# Patient Record
Sex: Male | Born: 1995 | Race: Black or African American | Hispanic: No | Marital: Single | Smoking: Never smoker
Health system: Southern US, Community
[De-identification: ages and names within clinical notes are randomized; demographics above are authoritative.]

## PROBLEM LIST (undated history)

## (undated) DIAGNOSIS — D709 Neutropenia, unspecified: Secondary | ICD-10-CM

## (undated) DIAGNOSIS — A549 Gonococcal infection, unspecified: Secondary | ICD-10-CM

## (undated) DIAGNOSIS — B20 Human immunodeficiency virus [HIV] disease: Secondary | ICD-10-CM

## (undated) DIAGNOSIS — F17200 Nicotine dependence, unspecified, uncomplicated: Secondary | ICD-10-CM

## (undated) HISTORY — DX: Nicotine dependence, unspecified, uncomplicated: F17.200

## (undated) HISTORY — DX: Human immunodeficiency virus (HIV) disease: B20

## (undated) HISTORY — DX: Gonococcal infection, unspecified: A54.9

## (undated) HISTORY — DX: Neutropenia, unspecified: D70.9

---

## 2016-12-03 ENCOUNTER — Other Ambulatory Visit: Payer: Self-pay

## 2016-12-03 ENCOUNTER — Encounter (HOSPITAL_COMMUNITY): Payer: Self-pay

## 2016-12-03 DIAGNOSIS — R531 Weakness: Secondary | ICD-10-CM | POA: Diagnosis not present

## 2016-12-03 DIAGNOSIS — R05 Cough: Secondary | ICD-10-CM | POA: Diagnosis not present

## 2016-12-03 DIAGNOSIS — R1084 Generalized abdominal pain: Secondary | ICD-10-CM | POA: Insufficient documentation

## 2016-12-03 DIAGNOSIS — R509 Fever, unspecified: Secondary | ICD-10-CM | POA: Diagnosis present

## 2016-12-03 DIAGNOSIS — R5383 Other fatigue: Secondary | ICD-10-CM | POA: Insufficient documentation

## 2016-12-03 LAB — COMPREHENSIVE METABOLIC PANEL
ALBUMIN: 3.6 g/dL (ref 3.5–5.0)
ALT: 30 U/L (ref 17–63)
ANION GAP: 6 (ref 5–15)
AST: 38 U/L (ref 15–41)
Alkaline Phosphatase: 37 U/L — ABNORMAL LOW (ref 38–126)
BILIRUBIN TOTAL: 0.7 mg/dL (ref 0.3–1.2)
BUN: 12 mg/dL (ref 6–20)
CHLORIDE: 101 mmol/L (ref 101–111)
CO2: 25 mmol/L (ref 22–32)
Calcium: 8.6 mg/dL — ABNORMAL LOW (ref 8.9–10.3)
Creatinine, Ser: 1.29 mg/dL — ABNORMAL HIGH (ref 0.61–1.24)
GFR calc Af Amer: >60 mL/min (ref >60)
GFR calc non Af Amer: >60 mL/min (ref >60)
GLUCOSE: 109 mg/dL — AB (ref 65–99)
POTASSIUM: 4.1 mmol/L (ref 3.5–5.1)
Sodium: 132 mmol/L — ABNORMAL LOW (ref 135–145)
TOTAL PROTEIN: 7.3 g/dL (ref 6.5–8.1)

## 2016-12-03 LAB — URINALYSIS, ROUTINE W REFLEX MICROSCOPIC
Bacteria, UA: NONE SEEN
Bilirubin Urine: NEGATIVE
GLUCOSE, UA: NEGATIVE mg/dL
HGB URINE DIPSTICK: NEGATIVE
Ketones, ur: NEGATIVE mg/dL
Leukocytes, UA: NEGATIVE
Nitrite: NEGATIVE
Protein, ur: 30 mg/dL — AB
SPECIFIC GRAVITY, URINE: 1.033 — AB (ref 1.005–1.030)
Squamous Epithelial / LPF: NONE SEEN
pH: 6 (ref 5.0–8.0)

## 2016-12-03 LAB — CBC
HCT: 44 % (ref 39.0–52.0)
HEMOGLOBIN: 14.8 g/dL (ref 13.0–17.0)
MCH: 27.9 pg (ref 26.0–34.0)
MCHC: 33.6 g/dL (ref 30.0–36.0)
MCV: 83 fL (ref 78.0–100.0)
Platelets: 202 10*3/uL (ref 150–400)
RBC: 5.3 MIL/uL (ref 4.22–5.81)
RDW: 13.4 % (ref 11.5–15.5)
WBC: 4.2 10*3/uL (ref 4.0–10.5)

## 2016-12-03 LAB — LIPASE, BLOOD: LIPASE: 48 U/L (ref 11–51)

## 2016-12-03 LAB — I-STAT CG4 LACTIC ACID, ED: Lactic Acid, Venous: 0.81 mmol/L (ref 0.5–1.9)

## 2016-12-03 MED ORDER — ACETAMINOPHEN 325 MG PO TABS
650.0000 mg | ORAL_TABLET | Freq: Once | ORAL | Status: AC
  Administered 2016-12-03: 650 mg via ORAL
  Filled 2016-12-03: qty 2

## 2016-12-03 NOTE — ED Triage Notes (Signed)
[facility] for cough, congestion and right sided abdominal pain. States that the pain resolved and has now returned and increased fever and cough. Patient alert and oriented, no vomiting, denies diarrhea

## 2016-12-04 ENCOUNTER — Emergency Department (HOSPITAL_COMMUNITY): Payer: BLUE CROSS/BLUE SHIELD

## 2016-12-04 ENCOUNTER — Emergency Department (HOSPITAL_COMMUNITY)
Admission: EM | Admit: 2016-12-04 | Discharge: 2016-12-04 | Disposition: A | Discharge status: Home / Self Care | Payer: BLUE CROSS/BLUE SHIELD | Attending: Emergency Medicine | Admitting: Emergency Medicine

## 2016-12-04 DIAGNOSIS — R6889 Other general symptoms and signs: Secondary | ICD-10-CM

## 2016-12-04 DIAGNOSIS — R509 Fever, unspecified: Secondary | ICD-10-CM

## 2016-12-04 LAB — RAPID STREP SCREEN (MED CTR MEBANE ONLY): Streptococcus, Group A Screen (Direct): NEGATIVE

## 2016-12-04 LAB — RAPID HIV SCREEN (HIV 1/2 AB+AG)
HIV 1/2 ANTIBODIES: NONREACTIVE
HIV-1 P24 ANTIGEN - HIV24: NONREACTIVE

## 2016-12-04 MED ORDER — SODIUM CHLORIDE 0.9 % IV BOLUS (SEPSIS)
1000.0000 mL | Freq: Once | INTRAVENOUS | Status: AC
  Administered 2016-12-04: 1000 mL via INTRAVENOUS

## 2016-12-04 MED ORDER — ACETAMINOPHEN 500 MG PO TABS
1000.0000 mg | ORAL_TABLET | Freq: Once | ORAL | Status: AC
  Administered 2016-12-04: 1000 mg via ORAL
  Filled 2016-12-04: qty 2

## 2016-12-04 MED ORDER — ONDANSETRON HCL 4 MG/2ML IJ SOLN
4.0000 mg | Freq: Once | INTRAMUSCULAR | Status: AC
  Administered 2016-12-04: 4 mg via INTRAVENOUS
  Filled 2016-12-04: qty 2

## 2016-12-04 NOTE — ED Notes (Signed)
Patient transported to X-ray 

## 2016-12-04 NOTE — ED Provider Notes (Signed)
8:00 AM Patient feels much better at this time.  Discharged home in good condition.  HIV testing will be obtained prior to discharge.  Patient will be contacted if results come back positive.  Feels better at this time.  Likely viral illness.  Recommended ongoing hydration and fever control at home.  Patient understands return to the emergency department for new or worsening symptoms.   Azalia Bilisampos, De Libman, MD 12/04/16 [phone]

## 2016-12-04 NOTE — ED Provider Notes (Signed)
[facility] EMERGENCY [facility]PARTMENT Provider Note   CSN: [id number] Arrival date & time: 12/03/16  1814     History   Chief Complaint Chief Complaint  Patient presents with  . Abdominal Pain    HPI Esmond CamperMarkell Elsass is a 21 y.o. male.  HPI  This is a 21 year old male who presents with fever, abdominal pain, cough, generalized weakness.  Patient states that he started feeling poorly on Thursday.  He felt generally weak and fatigued.  He has had chills.  He began to have abdominal pain yesterday morning with multiple episodes of emesis.  He reports normal bowel movements.  He was seen in urgent care.  Reports having a negative mono and flu test.  Reports a "scratchy" throat.  No difficulty tolerating fluids.  Patient was told that if he has persistent abdominal pain he would need to be reevaluated.  Patient states that he has had continued but not worsening abdominal pain.  It is in the right lower quadrant.  It is nonradiating.  It is currently 2 out of 10.  Febrile upon evaluation in triage.  History reviewed. No pertinent past medical history.  There are no active problems to display for this patient.   History reviewed. No pertinent surgical history.     Home Medications    Prior to Admission medications   Medication Sig Start Date End Date Taking? Authorizing Provider  benzonatate (TESSALON) 200 MG capsule Take 200 mg by mouth 3 (three) times daily as needed for cough.  12/03/16  Yes [provider]  ondansetron (ZOFRAN-ODT) 4 MG disintegrating tablet 4 mg every 8 (eight) hours as needed for nausea.  12/03/16  Yes [provider]    Family History No family history on file.  Social History Social History   Tobacco Use  . Smoking status: Never Smoker  . Smokeless tobacco: Never Used  Substance Use Topics  . Alcohol use: Not on file  . Drug use: Not on file     Allergies   Patient has no known allergies.   Review of  Systems Review of Systems  Constitutional: Positive for chills and fever.  HENT: Positive for sore throat. Negative for congestion and trouble swallowing.   Respiratory: Positive for cough. Negative for shortness of breath.   Cardiovascular: Negative for chest pain.  Gastrointestinal: Positive for abdominal pain, nausea and vomiting. Negative for constipation and diarrhea.  Genitourinary: Negative for dysuria.  All other systems reviewed and are negative.    Physical Exam Updated Vital Signs BP 109/71   Pulse 90   Temp (!) 103 F (39.4 C) (Oral)   Resp 18   Ht 5\' 11"  (1.803 m)   Wt 65.8 kg (145 lb)   SpO2 98%   BMI 20.22 kg/m   Physical Exam  Constitutional: He is oriented to person, place, and time.  Non-toxic appearance. He appears ill.  HENT:  Head: Normocephalic and atraumatic.  Mouth/Throat: Oropharyngeal exudate present.  Bilateral tonsillar swelling with tonsillar exudate Mucous membranes dry  Eyes: Pupils are equal, round, and reactive to light.  Neck: Neck supple.  Cardiovascular: Regular rhythm and normal heart sounds.  No murmur heard. Tachycardia  Pulmonary/Chest: Effort normal and breath sounds normal. No respiratory distress. He has no wheezes.  Abdominal: Soft. Bowel sounds are normal. There is no tenderness. There is no rebound.  Musculoskeletal: He exhibits no edema.  Lymphadenopathy:    He has no cervical adenopathy.  Neurological: He is alert and oriented to person, place,  and time.  Skin: Skin is warm and dry.  Psychiatric: He has a normal mood and affect.  Nursing note and vitals reviewed.    ED Treatments / Results  Labs (all labs ordered are listed, but only abnormal results are displayed) Labs Reviewed  COMPREHENSIVE METABOLIC PANEL - Abnormal; Notable for the following components:      Result Value   Sodium 132 (*)    Glucose, Bld 109 (*)    Creatinine, Ser 1.29 (*)    Calcium 8.6 (*)    Alkaline Phosphatase 37 (*)    All other  components within normal limits  URINALYSIS, ROUTINE W REFLEX MICROSCOPIC - Abnormal; Notable for the following components:   Specific Gravity, Urine 1.033 (*)    Protein, ur 30 (*)    All other components within normal limits  RAPID STREP SCREEN (NOT AT [facility])  CULTURE, GROUP A STREP ([facility])  LIPASE, BLOOD  CBC  HIV ANTIBODY (ROUTINE TESTING)  RAPID HIV SCREEN (HIV 1/2 AB+AG)  I-STAT CG4 LACTIC ACID, ED    EKG  EKG Interpretation None       Radiology Dg Chest 2 View  Result Date: 12/04/2016 CLINICAL DATA:  21 year old male with cough and fever. EXAM: CHEST  2 VIEW COMPARISON:  None. FINDINGS: The heart size and mediastinal contours are within normal limits. Both lungs are clear. The visualized skeletal structures are unremarkable. IMPRESSION: No active cardiopulmonary disease. Electronically Signed   By: Elgie CollardArash  Radparvar M.D.   On: 12/04/2016 06:33    Procedures Procedures (including critical care time)  Medications Ordered in ED Medications  acetaminophen (TYLENOL) tablet 650 mg (650 mg Oral Given 12/03/16 1835)  sodium chloride 0.9 % bolus 1,000 mL (1,000 mLs Intravenous New Bag/Given 12/04/16 0611)  ondansetron (ZOFRAN) injection 4 mg (4 mg Intravenous Given 12/04/16 0611)  acetaminophen (TYLENOL) tablet 1,000 mg (1,000 mg Oral Given 12/04/16 0612)     Initial Impression / Assessment and Plan / ED Course  I have reviewed the triage vital signs and the nursing notes.  Pertinent labs & imaging results that were available during my care of the patient were reviewed by me and considered in my medical decision making (see chart for details).  Clinical Course as of Dec 05 718  Mon Dec 04, 2016  [id number] Patient with negative strep screen and x-ray.  Would suspect with a fever of 103 he would have signs of peritonitis if this was related to appendicitis.  Overall clinical picture is more concerning for a systemic illness such as a virus.  I did asked the patient further history  questions.  He does engage in unprotected sex with men which would put him at risk for HIV.  I discussed with him sending an HIV screen which he consents to.  He is hungry and would like to eat.  Repeat abdominal exam is benign.  Patient was provided with food.  [CH]    Clinical Course User Index [CH] Horton, Mayer Maskerourtney F, MD    Patient presents with upper respiratory symptoms, cough, abdominal pain, and fever.  Nontoxic but ill-appearing on exam.  He is actively coughing.  He also has evidence of tonsillar exudate.  Her abdominal exam is fairly benign without point tenderness.  Lab work notable for mild acute kidney injury and evidence of dehydration.  No leukocytosis.  At this time would doubt appendicitis given reassuring exam and no white count in the setting of a fever of 103.  Much more suspicious of a flulike illness.  Strep screen and chest x-ray are negative.  He reports negative strep screen at urgent care.  See clinical course above.  HIV testing pending.  Patient requesting food.  Fluids and antiemetic provided as well.  Final Clinical Impressions(s) / ED Diagnoses   Final diagnoses:  Fever in adult  Flu-like symptoms    ED Discharge Orders    None       Horton, Mayer Masker, MD 12/04/16 [phone]

## 2016-12-06 LAB — CULTURE, GROUP A STREP (THRC)

## 2016-12-13 LAB — HIV 1/2 AB DIFFERENTIATION
HIV 1 AB: NEGATIVE
HIV 2 AB: NEGATIVE
NOTE (HIV CONF MULTISPOT): NEGATIVE

## 2016-12-13 LAB — HIV ANTIBODY (ROUTINE TESTING W REFLEX): HIV SCREEN 4TH GENERATION: REACTIVE — AB

## 2016-12-13 LAB — RNA QUALITATIVE

## 2016-12-15 ENCOUNTER — Telehealth: Payer: Self-pay | Admitting: Pharmacist Clinician (PhC)/ Clinical Pharmacy Specialist

## 2016-12-15 ENCOUNTER — Ambulatory Visit (INDEPENDENT_AMBULATORY_CARE_PROVIDER_SITE_OTHER): Payer: BLUE CROSS/BLUE SHIELD | Admitting: Infectious Disease

## 2016-12-15 ENCOUNTER — Ambulatory Visit (INDEPENDENT_AMBULATORY_CARE_PROVIDER_SITE_OTHER): Payer: BLUE CROSS/BLUE SHIELD | Admitting: Pharmacist

## 2016-12-15 ENCOUNTER — Encounter: Payer: Self-pay | Admitting: Infectious Disease

## 2016-12-15 ENCOUNTER — Other Ambulatory Visit (HOSPITAL_COMMUNITY)
Admission: RE | Admit: 2016-12-15 | Discharge: 2016-12-15 | Disposition: A | Discharge status: Home / Self Care | Payer: BLUE CROSS/BLUE SHIELD | Source: Ambulatory Visit | Attending: Infectious Disease | Admitting: Infectious Disease

## 2016-12-15 VITALS — BP 141/82 | HR 102 | Temp 98.9°F | Ht 71.5 in | Wt 140.0 lb

## 2016-12-15 DIAGNOSIS — Z23 Encounter for immunization: Secondary | ICD-10-CM

## 2016-12-15 DIAGNOSIS — B20 Human immunodeficiency virus [HIV] disease: Secondary | ICD-10-CM | POA: Diagnosis not present

## 2016-12-15 HISTORY — DX: Human immunodeficiency virus (HIV) disease: B20

## 2016-12-15 LAB — T-HELPER CELL (CD4) - (RCID CLINIC ONLY)
CD4 % Helper T Cell: 15 % — ABNORMAL LOW (ref 33–55)
CD4 T CELL ABS: 420 /uL (ref 400–2700)

## 2016-12-15 MED ORDER — BICTEGRAVIR-EMTRICITAB-TENOFOV 50-200-25 MG PO TABS: 1.0000 | ORAL_TABLET | Freq: Every day | ORAL | 11 refills | Status: DC

## 2016-12-15 MED FILL — BIKTARVY 50-200-25 MG TABS: 50-200-25 | 30 days supply | Qty: 30 | Fill #0

## 2016-12-15 NOTE — Progress Notes (Signed)
[facility]acy Visit  HPI: Arthur Nguyen is a 21 y.o. male who presents to the [facility] clinic today as new HIV patient to establish care.  Patient Active Problem List   Diagnosis Date Noted  . Acute HIV infection (HCC) 12/15/2016      Medication List        Accurate as of 12/15/16 10:20 AM. Always use your most recent med list.          benzonatate 200 MG capsule Commonly known as:  TESSALON   bictegravir-emtricitabine-tenofovir AF 50-200-25 MG Tabs tablet Commonly known as:  BIKTARVY Take 1 tablet by mouth daily.   ondansetron 4 MG disintegrating tablet Commonly known as:  ZOFRAN-ODT       Where to Get Your Medications    These medications were sent to [facility] [place], [place] - [address]  [address], [place] [address]   Phone:  [phone]   bictegravir-emtricitabine-tenofovir AF 50-200-25 MG Tabs tablet     Allergies: No Known Allergies  Past Medical History: Past Medical History:  Diagnosis Date  . Acute HIV infection (HCC) 12/15/2016    Social History: Social History   Socioeconomic History  . Marital status: Single    Spouse name: Not on file  . Number of children: Not on file  . Years of education: Not on file  . Highest education level: Not on file  Social Needs  . Financial resource strain: Not on file  . Food insecurity - worry: Not on file  . Food insecurity - inability: Not on file  . Transportation needs - medical: Not on file  . Transportation needs - non-medical: Not on file  Occupational History  . Not on file  Tobacco Use  . Smoking status: Never Smoker  . Smokeless tobacco: Never Used  Substance and Sexual Activity  . Alcohol use: Not on file  . Drug use: Not on file  . Sexual activity: Not on file  Other Topics Concern  . Not on file  Social History Narrative  . Not on file    Labs: No results found for: HIV1RNAQUANT, HIV1RNAVL, CD4TABS,  HEPBSAB, HEPBSAG, HCVAB  Lipids: No results found for: CHOL, TRIG, HDL, CHOLHDL, VLDL, LDLCALC  Current HIV Regimen: None  Assessment: [name] is here today to establish care with Dr. Daiva EvesVan Dam for his newly diagnosed HIV infection. He is a brand new diagnosis of acute HIV, so we will rapid start him on medications today.  He is dealing with his diagnosis well and knows of other friends that are HIV positive and undetectable.  We decided to start him on Biktarvy today.  I spent time going over the medication with him and how to take it. Emphasized the importance of compliance in order to prevent resistance from developing. I told him to avoid multivitamins or take them separate from [place]. He takes no vitamins at this time.  I also went over possible side effects and gave him my card to call me ASAP if he has any issues.  [facility], and he will pick up today.  He will come back and see me in ~1 month.  Plan: - Start Biktarvy PO once daily - Take at same time every day - F/u with me again for adherence and labs on 01/18/17 at 9am  Leia Coletti L. Kadyn Guild, PharmD, AAHIVP, CPP Infectious Diseases Clinical Pharmacist [facility] 12/15/2016, 10:20 AM

## 2016-12-15 NOTE — Telephone Encounter (Signed)
error 

## 2016-12-15 NOTE — Addendum Note (Signed)
Addended by: Wendall MolaOCKERHAM, Sanjna Haskew A on: 12/15/2016 11:27 AM   Modules accepted: Orders

## 2016-12-15 NOTE — Progress Notes (Signed)
I contacted pt on Thursday and arranged for him to come to [facility] at 930 appt slot but gave him green light to come at 9am. He has acute HIV and needs rapid start w Biktarvy vs Symtuza.   I did not tell him the diagnosis over the phone but affirmed his question that he was "positive for something" but something that he would need to see me to manage.

## 2016-12-15 NOTE — Progress Notes (Signed)
Chief complaint: recent flu like symptoms  Subjective:    Patient ID: Arthur Nguyen, male    DOB: July 05, 1995, 21 y.o.   MRN: [id number]  HPI  Arthur Nguyen is a 21 year old Black man who has just been diagnosed with Acute HIV infection.  [facility] had [facility] with flu like symptoms of malaise, fevers, chills, emesis, constipation, sore throat, worsening abdominal pain. He had been seen and checked for flu and mono which were both negative prior to being seen at ED. ED did further workup and tested him for HIV via rapid test which was negative and full fledged 4th gen assay which was +. Unfortunately the turnaround to 3rd step of the test was such that we were only alerted to + result on Wednesday night. (His EIA/p24 was +, discriminatory negative, qual RNA + = Acute HIV with qual RNA coming back so late)  I reached out to him yesterday. We considered possibility of offering him ACTG study for acute infection but he would seem too far along for Fiebig 1 and due to logistics as well seemed best to bring him in and initiate via rapid start today.  I had not revealed diagnosis over the phone but he figured out what was going on quickly. He was already also visited by [facility].   He has Manuel's card as well.  His risk factors for HIV are having sex with other men and being a young Black man.   He does not use IVDU  He had childhood asthma and has had seasonal allergies but no other major medical problems. No surgeries.  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  He has talked to several of his peers who are living with HIV and have undetectable viral load and this provided him with reassurance. He is very eager to start meds today.  Past Medical History:  Diagnosis Date  . Acute HIV infection (HCC) 12/15/2016    No past surgical history on file.  No family history on file.    Social  History   Socioeconomic History  . Marital status: Single    Spouse name: Not on file  . Number of children: Not on file  . Years of education: Not on file  . Highest education level: Not on file  Social Needs  . Financial resource strain: Not on file  . Food insecurity - worry: Not on file  . Food insecurity - inability: Not on file  . Transportation needs - medical: Not on file  . Transportation needs - non-medical: Not on file  Occupational History  . Not on file  Tobacco Use  . Smoking status: Never Smoker  . Smokeless tobacco: Never Used  Substance and Sexual Activity  . Alcohol use: Not on file  . Drug use: Not on file  . Sexual activity: Not on file  Other Topics Concern  . Not on file  Social History Narrative  . Not on file    No Known Allergies   Current Outpatient Medications:  .  benzonatate (TESSALON) 200 MG capsule, Take 200 mg by mouth 3 (three) times daily as needed for cough. , Disp: , Rfl:  .  bictegravir-emtricitabine-tenofovir AF (BIKTARVY) 50-200-25 MG TABS tablet, Take 1 tablet by mouth daily., Disp: 30 tablet, Rfl: 11 .  ondansetron (ZOFRAN-ODT) 4 MG disintegrating tablet, 4 mg every 8 (eight) hours as needed  for nausea. , Disp: , Rfl:    Review of Systems  Constitutional: Positive for chills, fatigue and fever. Negative for activity change, appetite change, diaphoresis and unexpected weight change.  HENT: Positive for sore throat. Negative for congestion, rhinorrhea, sinus pressure, sneezing and trouble swallowing.   Eyes: Negative for photophobia and visual disturbance.  Respiratory: Negative for cough, chest tightness, shortness of breath, wheezing and stridor.   Cardiovascular: Negative for chest pain, palpitations and leg swelling.  Gastrointestinal: Positive for abdominal pain and vomiting. Negative for abdominal distention, anal bleeding, blood in stool, constipation, diarrhea and nausea.  Genitourinary: Negative for difficulty urinating,  dysuria, flank pain and hematuria.  Musculoskeletal: Negative for arthralgias, back pain, gait problem, joint swelling and myalgias.  Skin: Negative for color change, pallor, rash and wound.  Neurological: Negative for dizziness, tremors, weakness and light-headedness.  Hematological: Negative for adenopathy. Does not bruise/bleed easily.  Psychiatric/Behavioral: Negative for agitation, behavioral problems, confusion, decreased concentration, dysphoric mood and sleep disturbance. The patient is nervous/anxious.        Objective:   Physical Exam  Constitutional: He is oriented to person, place, and time. He appears well-developed and well-nourished.  HENT:  Head: Normocephalic and atraumatic.  Mouth/Throat: Oropharynx is clear and moist. No oropharyngeal exudate.  Eyes: Conjunctivae and EOM are normal. Right eye exhibits no discharge. Left eye exhibits no discharge. No scleral icterus.  Neck: Normal range of motion. Neck supple.  Cardiovascular: Normal rate, regular rhythm and normal heart sounds. Exam reveals no gallop and no friction rub.  No murmur heard. Pulmonary/Chest: Effort normal and breath sounds normal. No respiratory distress. He has no wheezes. He has no rales. He exhibits no tenderness.  Abdominal: Soft. Bowel sounds are normal. He exhibits no distension.  Musculoskeletal: Normal range of motion. He exhibits no edema or tenderness.  Lymphadenopathy:    He has no cervical adenopathy.  Neurological: He is alert and oriented to person, place, and time. Coordination normal.  Skin: Skin is warm and dry. No rash noted. No erythema. No pallor.  Psychiatric: He has a normal mood and affect. His behavior is normal. Judgment and thought content normal.          Assessment & Plan:   Acute HIV infection: we will get baseline labs today. I have sent Biktarvy 30 d supply [facility] with 11 refills. We have activated copay card. Will bring him back in one month for repeat labs  and then visit with me in 5 weeks  Flu vaccine, pneumovaccine. Get records re meningococcus, hep b, a, HPV (doubt he was vaccinated fort this)  I spent greater than 60 minutes with the patient including greater than 50% of time in face to face counsel of the patient and in coordination of his care with ID pharmacy and specific counsel to him with re to the nature of HIV infection, specifically acute infection. I emphasized that at this very moment he would likely be highly infectious to others with whom he might have sex due to him undoubtedly having a viral load in the 100's of thousands to more likely millions of copies per ml. I asked him not to have sex until we could get his virus under control. We discussed ARV options and decided on Biktarvy. I told him this potent integrase inhibitor would be able to rapidly drop his viral load in a few weeks to a month, should have low side effects. I emphasized to him that we would be able to preserve his  likely high baseline CD4 and that he should have essentially a life expectancy, qualitatively and quantitatively no different than a matched peer. I told him about peer support services via [place] [facility], told him about [facility] (he has his card), our counselor, our dental services. I emphasized need for high level of adherence to his medication.

## 2016-12-16 LAB — QUANTIFERON TB GOLD ASSAY (BLOOD)
MITOGEN-NIL SO: 7.39 [IU]/mL
QUANTIFERON NIL VALUE: 0.16 [IU]/mL
QUANTIFERON TB AG MINUS NIL: 0.04 [IU]/mL
QUANTIFERON(R)-TB GOLD: NEGATIVE

## 2016-12-18 LAB — URINE CYTOLOGY ANCILLARY ONLY
CHLAMYDIA, DNA PROBE: NEGATIVE
Neisseria Gonorrhea: NEGATIVE

## 2016-12-18 LAB — CYTOLOGY, (ORAL, ANAL, URETHRAL) ANCILLARY ONLY
Chlamydia: NEGATIVE
Chlamydia: NEGATIVE
NEISSERIA GONORRHEA: NEGATIVE
NEISSERIA GONORRHEA: POSITIVE — AB

## 2016-12-20 ENCOUNTER — Telehealth: Payer: Self-pay | Admitting: *Deleted

## 2016-12-20 LAB — HEPATITIS C ANTIBODY
HEP C AB: NONREACTIVE
SIGNAL TO CUT-OFF: 0.06 (ref <1.00)

## 2016-12-20 LAB — COMPLETE METABOLIC PANEL WITH GFR
AG RATIO: 1.2 (calc) (ref 1.0–2.5)
ALKALINE PHOSPHATASE (APISO): 37 U/L — AB (ref 40–115)
ALT: 34 U/L (ref 9–46)
AST: 22 U/L (ref 10–40)
Albumin: 4.2 g/dL (ref 3.6–5.1)
BILIRUBIN TOTAL: 0.7 mg/dL (ref 0.2–1.2)
BUN: 7 mg/dL (ref 7–25)
CO2: 30 mmol/L (ref 20–32)
Calcium: 9.2 mg/dL (ref 8.6–10.3)
Chloride: 103 mmol/L (ref 98–110)
Creat: 0.89 mg/dL (ref 0.60–1.35)
GFR, Est African American: 141 mL/min/{1.73_m2} (ref >=60)
GFR, Est Non African American: 121 mL/min/{1.73_m2} (ref >=60)
GLOBULIN: 3.5 g/dL (ref 1.9–3.7)
Glucose, Bld: 92 mg/dL (ref 65–99)
POTASSIUM: 4.3 mmol/L (ref 3.5–5.3)
SODIUM: 141 mmol/L (ref 135–146)
Total Protein: 7.7 g/dL (ref 6.1–8.1)

## 2016-12-20 LAB — CBC WITH DIFFERENTIAL/PLATELET
BASOS PCT: 1.4 %
Basophils Absolute: 70 cells/uL (ref 0–200)
Eosinophils Absolute: 0 cells/uL — ABNORMAL LOW (ref 15–500)
Eosinophils Relative: 0 %
HCT: 44.6 % (ref 38.5–50.0)
Hemoglobin: 14.5 g/dL (ref 13.2–17.1)
Lymphs Abs: 2980 cells/uL (ref 850–3900)
MCH: 26.9 pg — ABNORMAL LOW (ref 27.0–33.0)
MCHC: 32.5 g/dL (ref 32.0–36.0)
MCV: 82.7 fL (ref 80.0–100.0)
MONOS PCT: 11.2 %
MPV: 8.8 fL (ref 7.5–12.5)
NEUTROS PCT: 27.8 %
Neutro Abs: 1390 cells/uL — ABNORMAL LOW (ref 1500–7800)
PLATELETS: 519 10*3/uL — AB (ref 140–400)
RBC: 5.39 10*6/uL (ref 4.20–5.80)
RDW: 13.2 % (ref 11.0–15.0)
TOTAL LYMPHOCYTE: 59.6 %
WBC mixed population: 560 cells/uL (ref 200–950)
WBC: 5 10*3/uL (ref 3.8–10.8)

## 2016-12-20 LAB — QUANTIFERON(R)-TB
Mitogen-Nil: 7.39 IU/mL
QUANTIFERON(R)-TB GOLD: NEGATIVE
Quantiferon Nil Value: 0.16 IU/mL
Quantiferon Tb Ag Minus Nil Value: 0.04 IU/mL

## 2016-12-20 LAB — HEPATITIS B SURFACE ANTIBODY, QUANTITATIVE

## 2016-12-20 LAB — LIPID PANEL
Cholesterol: 125 mg/dL (ref <200)
HDL: 31 mg/dL — ABNORMAL LOW (ref >40)
LDL Cholesterol (Calc): 77 mg/dL (calc)
NON-HDL CHOLESTEROL (CALC): 94 mg/dL (ref <130)
TRIGLYCERIDES: 83 mg/dL (ref <150)
Total CHOL/HDL Ratio: 4 (calc) (ref <5.0)

## 2016-12-20 LAB — HEPATITIS B SURFACE ANTIGEN: Hepatitis B Surface Ag: NONREACTIVE

## 2016-12-20 LAB — HLA B*5701: HLA-B 5701 W/RFLX HLA-B HIGH: NEGATIVE

## 2016-12-20 LAB — RPR: RPR Ser Ql: NONREACTIVE

## 2016-12-20 LAB — HEPATITIS A ANTIBODY, TOTAL: Hepatitis A AB,Total: NONREACTIVE

## 2016-12-20 NOTE — Telephone Encounter (Signed)
Seems like we should if there is no other way to get in touch with him so he is aware and treated. Thank you.

## 2016-12-20 NOTE — Telephone Encounter (Signed)
Received voice mail back from [facility]. Called back and got voice mail and left patient's name, date of birth, and phone number.

## 2016-12-20 NOTE — Telephone Encounter (Signed)
Called x 2 yesterday and today and no answer and no voice mail. Do you want me to notify the [facility] Department to go out to his home? Wendall MolaJacqueline Malone Vanblarcom CMA

## 2016-12-20 NOTE — Telephone Encounter (Signed)
Left message with Cheryln ManlyBrandy Sessoms at [facility] [phone] stating we have an STD to report. Unable to get in touch with patient and also advised to speak to a triage nurse.

## 2016-12-20 NOTE — Telephone Encounter (Signed)
-----   Message from Randall Hissornelius N Van Dam, MD sent at 12/18/2016  8:12 PM EST ----- This patient with recent acute HIV in whom I started meds last week [facility] and requires Ceftriaxone 250mg  and 1 gram of Azithromycin orally. Partners need to be tested and treated.

## 2017-01-01 LAB — HIV-1 INTEGRASE GENOTYPE

## 2017-01-01 LAB — HIV-1 GENOTYPE: HIV-1 GENOTYPE: DETECTED — AB

## 2017-01-01 LAB — HIV RNA, RTPCR W/R GT (RTI, PI,INT)
HIV 1 RNA QUANT: 5750000 {copies}/mL — AB
HIV-1 RNA Quant, Log: 6.76 Log copies/mL — ABNORMAL HIGH

## 2017-01-04 ENCOUNTER — Encounter: Payer: Self-pay | Admitting: Licensed Clinical Social Worker

## 2017-01-08 ENCOUNTER — Other Ambulatory Visit: Payer: BLUE CROSS/BLUE SHIELD

## 2017-01-08 DIAGNOSIS — B20 Human immunodeficiency virus [HIV] disease: Secondary | ICD-10-CM

## 2017-01-08 LAB — CBC WITH DIFFERENTIAL/PLATELET
BASOS ABS: 69 {cells}/uL (ref 0–200)
Basophils Relative: 1.3 %
Eosinophils Absolute: 239 cells/uL (ref 15–500)
Eosinophils Relative: 4.5 %
HEMATOCRIT: 38.5 % (ref 38.5–50.0)
HEMOGLOBIN: 13 g/dL — AB (ref 13.2–17.1)
LYMPHS ABS: 3196 {cells}/uL (ref 850–3900)
MCH: 27.9 pg (ref 27.0–33.0)
MCHC: 33.8 g/dL (ref 32.0–36.0)
MCV: 82.6 fL (ref 80.0–100.0)
MPV: 9.2 fL (ref 7.5–12.5)
Monocytes Relative: 9.4 %
NEUTROS ABS: 1299 {cells}/uL — AB (ref 1500–7800)
Neutrophils Relative %: 24.5 %
Platelets: 307 10*3/uL (ref 140–400)
RBC: 4.66 10*6/uL (ref 4.20–5.80)
RDW: 14.2 % (ref 11.0–15.0)
Total Lymphocyte: 60.3 %
WBC mixed population: 498 cells/uL (ref 200–950)
WBC: 5.3 10*3/uL (ref 3.8–10.8)

## 2017-01-08 LAB — COMPLETE METABOLIC PANEL WITH GFR
AG RATIO: 1.6 (calc) (ref 1.0–2.5)
ALT: 13 U/L (ref 9–46)
AST: 14 U/L (ref 10–40)
Albumin: 4.4 g/dL (ref 3.6–5.1)
Alkaline phosphatase (APISO): 42 U/L (ref 40–115)
BUN: 12 mg/dL (ref 7–25)
CALCIUM: 9.7 mg/dL (ref 8.6–10.3)
CO2: 29 mmol/L (ref 20–32)
Chloride: 103 mmol/L (ref 98–110)
Creat: 1.09 mg/dL (ref 0.60–1.35)
GFR, EST AFRICAN AMERICAN: 111 mL/min/{1.73_m2} (ref >=60)
GFR, EST NON AFRICAN AMERICAN: 96 mL/min/{1.73_m2} (ref >=60)
Globulin: 2.8 g/dL (calc) (ref 1.9–3.7)
Glucose, Bld: 116 mg/dL — ABNORMAL HIGH (ref 65–99)
POTASSIUM: 4.5 mmol/L (ref 3.5–5.3)
Sodium: 138 mmol/L (ref 135–146)
TOTAL PROTEIN: 7.2 g/dL (ref 6.1–8.1)
Total Bilirubin: 0.6 mg/dL (ref 0.2–1.2)

## 2017-01-09 LAB — T-HELPER CELL (CD4) - (RCID CLINIC ONLY)
CD4 % Helper T Cell: 24 % — ABNORMAL LOW (ref 33–55)
CD4 T CELL ABS: 820 /uL (ref 400–2700)

## 2017-01-10 LAB — HIV-1 RNA QUANT-NO REFLEX-BLD
HIV 1 RNA Quant: 468 copies/mL — ABNORMAL HIGH
HIV-1 RNA Quant, Log: 2.67 Log copies/mL — ABNORMAL HIGH

## 2017-01-10 MED FILL — BIKTARVY 50-200-25 MG TABS: 50-200-25 | 30 days supply | Qty: 30 | Fill #1

## 2017-01-18 ENCOUNTER — Ambulatory Visit (INDEPENDENT_AMBULATORY_CARE_PROVIDER_SITE_OTHER): Payer: BLUE CROSS/BLUE SHIELD | Admitting: Pharmacist

## 2017-01-18 ENCOUNTER — Ambulatory Visit (INDEPENDENT_AMBULATORY_CARE_PROVIDER_SITE_OTHER): Payer: BLUE CROSS/BLUE SHIELD

## 2017-01-18 DIAGNOSIS — A549 Gonococcal infection, unspecified: Secondary | ICD-10-CM

## 2017-01-18 DIAGNOSIS — B20 Human immunodeficiency virus [HIV] disease: Secondary | ICD-10-CM | POA: Diagnosis not present

## 2017-01-18 MED ORDER — CEFTRIAXONE SODIUM 250 MG IJ SOLR
250.0000 mg | Freq: Once | INTRAMUSCULAR | Status: AC
  Administered 2017-01-18: 250 mg via INTRAMUSCULAR

## 2017-01-18 MED ORDER — AZITHROMYCIN 250 MG PO TABS
250.0000 mg | ORAL_TABLET | Freq: Once | ORAL | Status: AC
  Administered 2017-01-18: 250 mg via ORAL

## 2017-01-18 NOTE — Progress Notes (Signed)
[facility] Pharmacy Visit  HPI: Arthur Nguyen is a 22 y.o. male who presents to the [facility] pharmacy clinic for HIV follow-up.   Patient Active Problem List   Diagnosis Date Noted  . Acute HIV infection (HCC) 12/15/2016    Patient's Medications  New Prescriptions   No medications on file  Previous Medications   BENZONATATE (TESSALON) 200 MG CAPSULE    Take 200 mg by mouth 3 (three) times daily as needed for cough.    BICTEGRAVIR-EMTRICITABINE-TENOFOVIR AF (BIKTARVY) 50-200-25 MG TABS TABLET    Take 1 tablet by mouth daily.   ONDANSETRON (ZOFRAN-ODT) 4 MG DISINTEGRATING TABLET    4 mg every 8 (eight) hours as needed for nausea.   Modified Medications   No medications on file  Discontinued Medications   No medications on file    Allergies: No Known Allergies  Past Medical History: Past Medical History:  Diagnosis Date  . Acute HIV infection (HCC) 12/15/2016    Social History: Social History   Socioeconomic History  . Marital status: Single    Spouse name: Not on file  . Number of children: Not on file  . Years of education: Not on file  . Highest education level: Not on file  Social Needs  . Financial resource strain: Not on file  . Food insecurity - worry: Not on file  . Food insecurity - inability: Not on file  . Transportation needs - medical: Not on file  . Transportation needs - non-medical: Not on file  Occupational History  . Not on file  Tobacco Use  . Smoking status: Never Smoker  . Smokeless tobacco: Never Used  Substance and Sexual Activity  . Alcohol use: Not on file  . Drug use: Not on file  . Sexual activity: Not on file  Other Topics Concern  . Not on file  Social History Narrative  . Not on file    Labs: HIV 1 RNA Quant (copies/mL)  Date Value  01/08/2017 468 (H)  12/15/2016 5,750,000 (H)   CD4 T Cell Abs (/uL)  Date Value  01/08/2017 820  12/15/2016 420   Hepatitis B Surface Ag (no units)  Date Value   12/15/2016 NON-REACTIVE    Lipids:    Component Value Date/Time   CHOL 125 12/15/2016 1121   TRIG 83 12/15/2016 1121   HDL 31 (L) 12/15/2016 1121   CHOLHDL 4.0 12/15/2016 1121    Current HIV Regimen: Biktarvy  Assessment: Arthur Nguyen was recently seen by Dr. Daiva EvesVan Dam on 12/14 as a newly diagnosed patient with HIV. We started him on Biktarvy at that time. He is doing very well on the medication. He was worried because his dosing has been between 10:40am and 11am and he was worried about it being inconsistent.  I told him that it was completely fine that he was taking it around that time.  He hasn't missed any doses and is not having any side effects whatsoever.  Of note, he was positive for rectal gonorrhea when he was seen back in December.  Our clinic, as well as the health department have been trying to reach him.  He tells me today that his phone has been off.  He has not received treatment.  I put him on the nurse schedule and Tammy gave him his shot of rocephin and 1gm PO dose of azithromycin.  I told him to abstain from any sexual interaction for a week and to tell his partners to be tested/treated. He  had a f/u with Dr. Daiva Eves scheduled for Monday, but I will push that out until next month. He had labs drawn last week and his HIV viral load already decreased from 5.7 million to 468, and his CD4 count increased from 420 to 820. He feels good.   Plan: - Continue Biktarvy PO once daily - Ceftriaxone 250 mg IM x 1 - Azithromycin 1gm PO x 1 - F/u with Dr. Daiva Eves 2/27 at 415pm  Shaquella Stamant L. Jaree Dwight, PharmD, AAHIVP, CPP Infectious Diseases Clinical Pharmacist [facility] 01/18/2017, 10:36 AM

## 2017-01-22 ENCOUNTER — Ambulatory Visit: Payer: BLUE CROSS/BLUE SHIELD | Admitting: Infectious Disease

## 2017-02-02 ENCOUNTER — Encounter: Payer: Self-pay | Admitting: *Deleted

## 2017-02-06 MED FILL — BIKTARVY 50-200-25 MG TABS: 50-200-25 | 30 days supply | Qty: 30 | Fill #2

## 2017-02-28 ENCOUNTER — Encounter: Payer: Self-pay | Admitting: Infectious Disease

## 2017-02-28 ENCOUNTER — Ambulatory Visit (INDEPENDENT_AMBULATORY_CARE_PROVIDER_SITE_OTHER): Payer: BLUE CROSS/BLUE SHIELD | Admitting: Infectious Disease

## 2017-02-28 VITALS — BP 135/77 | HR 70 | Temp 98.4°F | Ht 71.0 in | Wt 157.0 lb

## 2017-02-28 DIAGNOSIS — Z23 Encounter for immunization: Secondary | ICD-10-CM | POA: Diagnosis not present

## 2017-02-28 DIAGNOSIS — B20 Human immunodeficiency virus [HIV] disease: Secondary | ICD-10-CM

## 2017-02-28 DIAGNOSIS — F17200 Nicotine dependence, unspecified, uncomplicated: Secondary | ICD-10-CM

## 2017-02-28 DIAGNOSIS — F1290 Cannabis use, unspecified, uncomplicated: Secondary | ICD-10-CM | POA: Diagnosis not present

## 2017-02-28 NOTE — Progress Notes (Signed)
Chief complaint: Follow-up for HIV on medications  Subjective:    Patient ID: Arthur Nguyen, male    DOB: 1995/07/07, 22 y.o.   MRN: [id number]  HPI  Arthur Nguyen is a 22year old [place] man who was diagnosed with Acute HIV infection December 2018 when he also had gonorrhea.  [facility] with flu like symptoms of malaise, fevers, chills, emesis, constipation, sore throat, worsening abdominal pain. He had been seen and checked for flu and mono which were both negative prior to being seen at ED. ED did further workup and tested him for HIV via rapid test which was negative and full fledged 4th gen assay which was +. Unfortunately the turnaround to 3rd step of the back late and we could not bring him into the I reached out to him yesterday. 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  Labs came back with wild-type virus with no significant and RTI NR NNRTI PIR integrase resistance mutations.  Tolerated the BIKTARVY very well with no symptoms whatsoever.  His viral load has dropped dramatically from 5,750,000 copies to 460 copies and less than a month.  He states he is still being highly adherent to his medications since we last checked labs on him on January 7.  Lab Results  Component Value Date   HIV1RNAQUANT 468 (H) 01/08/2017   HIV1RNAQUANT 5,750,000 (H) 12/15/2016   Lab Results  Component Value Date   CD4TABS 820 01/08/2017   CD4TABS 420 12/15/2016     Social History   Socioeconomic History  . Marital status: Single    Spouse name: None  . Number of children: None  . Years of education: None  . Highest education level: None  Social Needs  . Financial resource strain: None  . Food insecurity - worry: None  . Food insecurity - inability: None  . Transportation needs - medical: None  . Transportation needs -  non-medical: None  Occupational History  . None  Tobacco Use  . Smoking status: Never Smoker  . Smokeless tobacco: Never Used  Substance and Sexual Activity  . Alcohol use: None  . Drug use: None  . Sexual activity: None  Other Topics Concern  . None  Social History Narrative  . None    No Known Allergies   Current Outpatient Medications:  .  bictegravir-emtricitabine-tenofovir AF (BIKTARVY) 50-200-25 MG TABS tablet, Take 1 tablet by mouth daily., Disp: 30 tablet, Rfl: 11 .  benzonatate (TESSALON) 200 MG capsule, Take 200 mg by mouth 3 (three) times daily as needed for cough. , Disp: , Rfl:  .  ondansetron (ZOFRAN-ODT) 4 MG disintegrating tablet, 4 mg every 8 (eight) hours as needed for nausea. , Disp: , Rfl:    Review of Systems  Constitutional: Negative for activity change, appetite change, chills, diaphoresis, fatigue, fever and unexpected weight change.  HENT: Negative for congestion, rhinorrhea, sinus pressure, sneezing, sore throat and trouble swallowing.   Eyes: Negative for photophobia and visual disturbance.  Respiratory: Negative for cough, chest tightness, shortness of breath, wheezing and stridor.   Cardiovascular: Negative for chest pain, palpitations and leg swelling.  Gastrointestinal: Negative for abdominal distention, abdominal pain, anal bleeding, blood in stool, constipation, diarrhea, nausea and vomiting.  Genitourinary: Negative for difficulty urinating, dysuria, flank pain and hematuria.  Musculoskeletal: Negative for arthralgias, back pain, gait  problem, joint swelling and myalgias.  Skin: Negative for color change, pallor, rash and wound.  Neurological: Negative for dizziness, tremors, weakness and light-headedness.  Hematological: Negative for adenopathy. Does not bruise/bleed easily.  Psychiatric/Behavioral: Negative for agitation, behavioral problems, confusion, decreased concentration, dysphoric mood and sleep disturbance. The patient is not  nervous/anxious.        Objective:   Physical Exam  Constitutional: He is oriented to person, place, and time. He appears well-developed and well-nourished.  HENT:  Head: Normocephalic and atraumatic.  Mouth/Throat: Oropharynx is clear and moist. No oropharyngeal exudate.  Eyes: Conjunctivae and EOM are normal. Right eye exhibits no discharge. Left eye exhibits no discharge. No scleral icterus.  Neck: Normal range of motion. Neck supple.  Cardiovascular: Normal rate and regular rhythm.  Pulmonary/Chest: Effort normal. No respiratory distress. He has no wheezes.  Abdominal: Soft. Bowel sounds are normal. He exhibits no distension.  Musculoskeletal: Normal range of motion. He exhibits no edema or tenderness.  Lymphadenopathy:    He has no cervical adenopathy.  Neurological: He is alert and oriented to person, place, and time. Coordination normal.  Skin: Skin is warm and dry. No rash noted. No erythema. No pallor.  Psychiatric: He has a normal mood and affect. His behavior is normal. Judgment and thought content normal.  Nursing note and vitals reviewed.         Assessment & Plan:   Acute HIV infection: Continue BIKTARVY check labs today  Need for vaccines: I will give him HPV vaccine today and a second dose in a month.  We will also give him a pneumonia shot today he also needs to have a and hep B vaccinations but might be a candidate for the ACT G hepatitis B vaccine study.  Smoking: he endorses smoking marijuana and on weekends also "black and tans" We will continue to work on tobacco cessation.   I spent greater than 25 minutes with the patient including greater than 50% of time in face to face counsel of the patient re the nature of HIV infection, commending him on his excellent adherence, and reviewing vaccines that would be needed and in coordination of their care.

## 2017-03-02 LAB — HIV-1 RNA QUANT-NO REFLEX-BLD
HIV 1 RNA Quant: 54 copies/mL — ABNORMAL HIGH
HIV-1 RNA Quant, Log: 1.73 Log copies/mL — ABNORMAL HIGH

## 2017-03-08 MED FILL — BIKTARVY 50-200-25 MG TABS: 50-200-25 | 30 days supply | Qty: 30 | Fill #3

## 2017-03-29 ENCOUNTER — Encounter: Payer: Self-pay | Admitting: Infectious Disease

## 2017-03-29 ENCOUNTER — Other Ambulatory Visit (HOSPITAL_COMMUNITY)
Admission: RE | Admit: 2017-03-29 | Discharge: 2017-03-29 | Disposition: A | Discharge status: Home / Self Care | Payer: BLUE CROSS/BLUE SHIELD | Source: Ambulatory Visit | Attending: Infectious Disease | Admitting: Infectious Disease

## 2017-03-29 ENCOUNTER — Ambulatory Visit (INDEPENDENT_AMBULATORY_CARE_PROVIDER_SITE_OTHER): Payer: BLUE CROSS/BLUE SHIELD | Admitting: Infectious Disease

## 2017-03-29 VITALS — BP 132/75 | HR 78 | Ht 71.0 in | Wt 162.0 lb

## 2017-03-29 DIAGNOSIS — Z23 Encounter for immunization: Secondary | ICD-10-CM

## 2017-03-29 DIAGNOSIS — A549 Gonococcal infection, unspecified: Secondary | ICD-10-CM

## 2017-03-29 DIAGNOSIS — B20 Human immunodeficiency virus [HIV] disease: Secondary | ICD-10-CM | POA: Insufficient documentation

## 2017-03-29 HISTORY — DX: Gonococcal infection, unspecified: A54.9

## 2017-03-29 NOTE — Addendum Note (Signed)
Addended by: Gildardo GriffesHILL, ASHLEY M on: 03/29/2017 05:00 PM   Modules accepted: Orders

## 2017-03-29 NOTE — Progress Notes (Signed)
Chief complaint: Follow-up for HIV on medications  Subjective:    Patient ID: Arthur Nguyen, male    DOB: 1995-05-28, 22 y.o.   MRN: [id number]  HPI  Arthur Nguyen is a [age]year old [place] man who was diagnosed with Acute HIV infection December 2018 when he also had rectal gonorrhea.  [facility] flu like symptoms of malaise, fevers, chills, emesis, constipation, sore throat, worsening abdominal pain. He had been seen and checked for flu and mono which were both negative prior to being seen at ED. ED did further workup and tested him for HIV via rapid test which was negative and full fledged 4th gen assay which was +. Unfortunately the turnaround to 3rd step of the back late and we could not bring him into the I reached out to him yesterday. 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  Labs came back with wild-type virus with no significant and RTI NR NNRTI PIR integrase resistance mutations.  Tolerated the BIKTARVY very well with no symptoms whatsoever.  His viral load has dropped dramatically from 5,750,000 copies to 460 copies and less than a month then down to 54 copies. He says he has not missed a single dose and only recalls being late by an hour once.    Lab Results  Component Value Date   HIV1RNAQUANT 54 (H) 02/28/2017   HIV1RNAQUANT 468 (H) 01/08/2017   HIV1RNAQUANT 5,750,000 (H) 12/15/2016   Lab Results  Component Value Date   CD4TABS 820 01/08/2017   CD4TABS 420 12/15/2016     Social History   Socioeconomic History  . Marital status: Single    Spouse name: Not on file  . Number of children: Not on file  . Years of education: Not on file  . Highest education level: Not on file  Occupational History  . Not on file  Social Needs  . Financial resource strain: Not on file  . Food insecurity:   Worry: Not on file    Inability: Not on file  . Transportation needs:    Medical: Not on file    Non-medical: Not on file  Tobacco Use  . Smoking status: Never Smoker  . Smokeless tobacco: Never Used  Substance and Sexual Activity  . Alcohol use: Not on file  . Drug use: Not on file  . Sexual activity: Not on file  Lifestyle  . Physical activity:    Days per week: Not on file    Minutes per session: Not on file  . Stress: Not on file  Relationships  . Social connections:    Talks on phone: Not on file    Gets together: Not on file    Attends religious service: Not on file    Active member of club or organization: Not on file    Attends meetings of clubs or organizations: Not on file    Relationship status: Not on file  Other Topics Concern  . Not on file  Social History Narrative  . Not on file    No Known Allergies   Current Outpatient Medications:  .  bictegravir-emtricitabine-tenofovir AF (BIKTARVY) 50-200-25 MG TABS tablet, Take 1 tablet by mouth daily., Disp: 30 tablet, Rfl: 11 .  benzonatate (TESSALON) 200 MG capsule, Take 200 mg by mouth 3 (three) times daily as needed for cough. ,  Disp: , Rfl:  .  ondansetron (ZOFRAN-ODT) 4 MG disintegrating tablet, 4 mg every 8 (eight) hours as needed for nausea. , Disp: , Rfl:    Review of Systems  Constitutional: Negative for activity change, appetite change, chills, diaphoresis, fatigue, fever and unexpected weight change.  HENT: Negative for congestion, rhinorrhea, sinus pressure, sneezing, sore throat and trouble swallowing.   Eyes: Negative for photophobia and visual disturbance.  Respiratory: Negative for cough, chest tightness, shortness of breath, wheezing and stridor.   Cardiovascular: Negative for chest pain, palpitations and leg swelling.  Gastrointestinal: Negative for abdominal distention, abdominal pain, anal bleeding, blood in stool, constipation, diarrhea, nausea and vomiting.  Genitourinary: Negative for  difficulty urinating, dysuria, flank pain and hematuria.  Musculoskeletal: Negative for arthralgias, back pain, gait problem, joint swelling and myalgias.  Skin: Negative for color change, pallor, rash and wound.  Neurological: Negative for dizziness, tremors, weakness and light-headedness.  Hematological: Negative for adenopathy. Does not bruise/bleed easily.  Psychiatric/Behavioral: Negative for agitation, behavioral problems, confusion, decreased concentration, dysphoric mood and sleep disturbance. The patient is not nervous/anxious.        Objective:   Physical Exam  Constitutional: He is oriented to person, place, and time. He appears well-developed and well-nourished.  HENT:  Head: Normocephalic and atraumatic.  Mouth/Throat: Oropharynx is clear and moist. No oropharyngeal exudate.  Eyes: Conjunctivae and EOM are normal. Right eye exhibits no discharge. Left eye exhibits no discharge. No scleral icterus.  Neck: Normal range of motion. Neck supple.  Cardiovascular: Normal rate and regular rhythm.  Pulmonary/Chest: Effort normal. No respiratory distress. He has no wheezes.  Abdominal: Soft. Bowel sounds are normal. He exhibits no distension.  Musculoskeletal: Normal range of motion. He exhibits no edema or tenderness.  Lymphadenopathy:    He has no cervical adenopathy.  Neurological: He is alert and oriented to person, place, and time. Coordination normal.  Skin: Skin is warm and dry. No rash noted. No erythema. No pallor.  Psychiatric: He has a normal mood and affect. His behavior is normal. Judgment and thought content normal.  Nursing note and vitals reviewed.         Assessment & Plan:   Acute HIV infection: Continue BIKTARVY and check labs in 3 months   Need for vaccines: I will give him HPV vaccine #2 today, Hep A #1. He would likely qualify for HBV vaccine via ACTG if he was vaccinated in [place] and would be interested in this study so I will hold off on  re-vaccinating him for now. The TAF in his ARV will likely also protect vs HBV as well  Smoking: he endorses smoking marijuana and on weekends also "black and tans"   Rectal gonorrhea: recheck extragenital and urine for GC and chlamydia

## 2017-03-30 LAB — CYTOLOGY, (ORAL, ANAL, URETHRAL) ANCILLARY ONLY
CHLAMYDIA, DNA PROBE: NEGATIVE
CHLAMYDIA, DNA PROBE: NEGATIVE
Neisseria Gonorrhea: NEGATIVE
Neisseria Gonorrhea: NEGATIVE

## 2017-03-30 LAB — URINE CYTOLOGY ANCILLARY ONLY
Chlamydia: NEGATIVE
NEISSERIA GONORRHEA: NEGATIVE

## 2017-04-02 MED FILL — BIKTARVY 50-200-25 MG TABS: 50-200-25 | 30 days supply | Qty: 30 | Fill #4

## 2017-05-02 MED FILL — BIKTARVY 50-200-25 MG TABS: 50-200-25 | 30 days supply | Qty: 30 | Fill #5

## 2017-05-31 MED FILL — BIKTARVY 50-200-25 MG TABS: 50-200-25 | 30 days supply | Qty: 30 | Fill #6

## 2017-06-06 ENCOUNTER — Telehealth: Payer: Self-pay

## 2017-06-06 NOTE — Telephone Encounter (Signed)
Called pt to reschedule an appt with lab, pt stated that he could be seen for labs the same week just not on the 18th. Pt is scheduled for the 19th at 1045. Lorenso CourierJose L Maldonado, [place]

## 2017-06-19 ENCOUNTER — Other Ambulatory Visit: Payer: BLUE CROSS/BLUE SHIELD

## 2017-06-20 ENCOUNTER — Other Ambulatory Visit: Payer: BLUE CROSS/BLUE SHIELD

## 2017-06-20 ENCOUNTER — Other Ambulatory Visit (HOSPITAL_COMMUNITY)
Admission: RE | Admit: 2017-06-20 | Discharge: 2017-06-20 | Disposition: A | Discharge status: Home / Self Care | Payer: BLUE CROSS/BLUE SHIELD | Source: Ambulatory Visit | Attending: Infectious Disease | Admitting: Infectious Disease

## 2017-06-20 DIAGNOSIS — B20 Human immunodeficiency virus [HIV] disease: Secondary | ICD-10-CM | POA: Insufficient documentation

## 2017-06-21 LAB — T-HELPER CELL (CD4) - (RCID CLINIC ONLY)
CD4 % Helper T Cell: 27 % — ABNORMAL LOW (ref 33–55)
CD4 T Cell Abs: 710 /uL (ref 400–2700)

## 2017-06-21 LAB — COMPLETE METABOLIC PANEL WITH GFR
AG Ratio: 1.4 (calc) (ref 1.0–2.5)
ALBUMIN MSPROF: 4.3 g/dL (ref 3.6–5.1)
ALKALINE PHOSPHATASE (APISO): 36 U/L — AB (ref 40–115)
ALT: 13 U/L (ref 9–46)
BUN: 14 mg/dL (ref 7–25)
CO2: 27 mmol/L (ref 20–32)
Calcium: 9.7 mg/dL (ref 8.6–10.3)
Chloride: 104 mmol/L (ref 98–110)
Creat: 1.04 mg/dL (ref 0.60–1.35)
GFR, EST NON AFRICAN AMERICAN: 101 mL/min/{1.73_m2} (ref >=60)
GFR, Est African American: 117 mL/min/{1.73_m2} (ref >=60)
GLOBULIN: 3.1 g/dL (ref 1.9–3.7)
Glucose, Bld: 74 mg/dL (ref 65–99)
SODIUM: 138 mmol/L (ref 135–146)
Total Protein: 7.4 g/dL (ref 6.1–8.1)

## 2017-06-21 LAB — CBC WITH DIFFERENTIAL/PLATELET
BASOS ABS: 41 {cells}/uL (ref 0–200)
Basophils Relative: 0.9 %
EOS ABS: 293 {cells}/uL (ref 15–500)
Eosinophils Relative: 6.5 %
HEMATOCRIT: 42 % (ref 38.5–50.0)
HEMOGLOBIN: 14.3 g/dL (ref 13.2–17.1)
LYMPHS ABS: 2651 {cells}/uL (ref 850–3900)
MCH: 28.7 pg (ref 27.0–33.0)
MCHC: 34 g/dL (ref 32.0–36.0)
MCV: 84.3 fL (ref 80.0–100.0)
MPV: 9.4 fL (ref 7.5–12.5)
Monocytes Relative: 10.9 %
NEUTROS ABS: 1026 {cells}/uL — AB (ref 1500–7800)
NEUTROS PCT: 22.8 %
Platelets: 352 10*3/uL (ref 140–400)
RBC: 4.98 10*6/uL (ref 4.20–5.80)
RDW: 13.4 % (ref 11.0–15.0)
Total Lymphocyte: 58.9 %
WBC: 4.5 10*3/uL (ref 3.8–10.8)
WBCMIX: 491 {cells}/uL (ref 200–950)

## 2017-06-21 LAB — URINE CYTOLOGY ANCILLARY ONLY
Chlamydia: NEGATIVE
Neisseria Gonorrhea: NEGATIVE

## 2017-06-21 LAB — RPR: RPR Ser Ql: NONREACTIVE

## 2017-06-24 LAB — HIV-1 RNA QUANT-NO REFLEX-BLD
HIV 1 RNA QUANT: 99 {copies}/mL — AB
HIV-1 RNA Quant, Log: 2 Log copies/mL — ABNORMAL HIGH

## 2017-06-28 ENCOUNTER — Encounter: Payer: Self-pay | Admitting: Infectious Disease

## 2017-07-03 ENCOUNTER — Encounter: Payer: BLUE CROSS/BLUE SHIELD | Admitting: Infectious Disease

## 2017-07-03 MED FILL — BIKTARVY 50-200-25 MG TABS: 50-200-25 | 30 days supply | Qty: 30 | Fill #7

## 2017-07-11 ENCOUNTER — Encounter: Payer: Self-pay | Admitting: Infectious Disease

## 2017-07-11 ENCOUNTER — Other Ambulatory Visit: Payer: Self-pay

## 2017-07-11 ENCOUNTER — Other Ambulatory Visit (HOSPITAL_COMMUNITY)
Admission: RE | Admit: 2017-07-11 | Discharge: 2017-07-11 | Disposition: A | Discharge status: Home / Self Care | Payer: BLUE CROSS/BLUE SHIELD | Source: Ambulatory Visit | Attending: Infectious Disease | Admitting: Infectious Disease

## 2017-07-11 ENCOUNTER — Ambulatory Visit (INDEPENDENT_AMBULATORY_CARE_PROVIDER_SITE_OTHER): Payer: BLUE CROSS/BLUE SHIELD | Admitting: Infectious Disease

## 2017-07-11 VITALS — BP 121/80 | HR 74 | Temp 98.5°F | Ht 72.0 in | Wt 160.0 lb

## 2017-07-11 DIAGNOSIS — A549 Gonococcal infection, unspecified: Secondary | ICD-10-CM

## 2017-07-11 DIAGNOSIS — D708 Other neutropenia: Secondary | ICD-10-CM

## 2017-07-11 DIAGNOSIS — Z79899 Other long term (current) drug therapy: Secondary | ICD-10-CM | POA: Diagnosis not present

## 2017-07-11 DIAGNOSIS — F17200 Nicotine dependence, unspecified, uncomplicated: Secondary | ICD-10-CM | POA: Insufficient documentation

## 2017-07-11 DIAGNOSIS — Z113 Encounter for screening for infections with a predominantly sexual mode of transmission: Secondary | ICD-10-CM | POA: Diagnosis not present

## 2017-07-11 DIAGNOSIS — B20 Human immunodeficiency virus [HIV] disease: Secondary | ICD-10-CM

## 2017-07-11 DIAGNOSIS — D709 Neutropenia, unspecified: Secondary | ICD-10-CM | POA: Insufficient documentation

## 2017-07-11 HISTORY — DX: Neutropenia, unspecified: D70.9

## 2017-07-11 HISTORY — DX: Nicotine dependence, unspecified, uncomplicated: F17.200

## 2017-07-11 NOTE — Patient Instructions (Signed)
RN visit for HPV vaccine #3 in late August

## 2017-07-11 NOTE — Progress Notes (Signed)
Chief complaint: Follow-up for HIV on medications he is bothered that VL is up to 99  Subjective:    Patient ID: Arthur Nguyen, male    DOB: [birth year], 22 y.o.   MRN: [id number]  HPI  Arthur Nguyen is a 22year old [place] man who was diagnosed with Acute HIV infection December 2018 when he also had rectal gonorrhea.  He had presented to ED at [facility] with flu like symptoms of malaise, fevers, chills, emesis, constipation, sore throat, worsening abdominal pain. He had been seen and checked for flu and mono which were both negative prior to being seen at ED. ED did further workup and tested him for HIV via rapid test which was negative and full fledged 4th gen assay which was +. Unfortunately the turnaround to 3rd step of the back late and we could not bring him into the I reached out to him yesterday. 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  Labs came back with wild-type virus with no significant and RTI NR NNRTI PIR integrase resistance mutations.  Tolerated the BIKTARVY very well with no symptoms whatsoever.  His viral load has dropped dramatically from 5,750,000 copies to 460 copies and less than a month then down to 54 copies. Now 99 He says he has not missed a single dose. .    Lab Results  Component Value Date   HIV1RNAQUANT 99 (H) 06/20/2017   HIV1RNAQUANT 54 (H) 02/28/2017   HIV1RNAQUANT 468 (H) 01/08/2017   Lab Results  Component Value Date   CD4TABS 710 06/20/2017   CD4TABS 820 01/08/2017   CD4TABS 420 12/15/2016    We reviewed his labs and noticed his ANC has been low, not  Terribly low but below normal range every time we have checkec it. He states that he typically suffers from sinus infection every 2 months.   Social History   Socioeconomic History  . Marital status: Single    Spouse name: Not on file  . Number of  children: Not on file  . Years of education: Not on file  . Highest education level: Not on file  Occupational History  . Not on file  Social Needs  . Financial resource strain: Not on file  . Food insecurity:    Worry: Not on file    Inability: Not on file  . Transportation needs:    Medical: Not on file    Non-medical: Not on file  Tobacco Use  . Smoking status: Never Smoker  . Smokeless tobacco: Never Used  Substance and Sexual Activity  . Alcohol use: Not on file  . Drug use: Not on file  . Sexual activity: Not on file  Lifestyle  . Physical activity:    Days per week: Not on file    Minutes per session: Not on file  . Stress: Not on file  Relationships  . Social connections:    Talks on phone: Not on file    Gets together: Not on file    Attends religious service: Not on file    Active member of club or organization: Not on file    Attends meetings of clubs or organizations: Not on file    Relationship status: Not on file  Other Topics Concern  . Not on file  Social History Narrative  . Not on file    No Known  Allergies   Current Outpatient Medications:  .  benzonatate (TESSALON) 200 MG capsule, Take 200 mg by mouth 3 (three) times daily as needed for cough. , Disp: , Rfl:  .  bictegravir-emtricitabine-tenofovir AF (BIKTARVY) 50-200-25 MG TABS tablet, Take 1 tablet by mouth daily., Disp: 30 tablet, Rfl: 11 .  ondansetron (ZOFRAN-ODT) 4 MG disintegrating tablet, 4 mg every 8 (eight) hours as needed for nausea. , Disp: , Rfl:    Review of Systems  Constitutional: Negative for activity change, appetite change, chills, diaphoresis, fatigue, fever and unexpected weight change.  HENT: Negative for congestion, rhinorrhea, sinus pressure, sneezing, sore throat and trouble swallowing.   Eyes: Negative for photophobia and visual disturbance.  Respiratory: Negative for cough, chest tightness, shortness of breath, wheezing and stridor.   Cardiovascular: Negative for  chest pain, palpitations and leg swelling.  Gastrointestinal: Negative for abdominal distention, abdominal pain, anal bleeding, blood in stool, constipation, diarrhea, nausea and vomiting.  Genitourinary: Negative for difficulty urinating, dysuria, flank pain and hematuria.  Musculoskeletal: Negative for arthralgias, back pain, gait problem, joint swelling and myalgias.  Skin: Negative for color change, pallor, rash and wound.  Neurological: Negative for dizziness, tremors, weakness and light-headedness.  Hematological: Negative for adenopathy. Does not bruise/bleed easily.  Psychiatric/Behavioral: Negative for agitation, behavioral problems, confusion, decreased concentration, dysphoric mood and sleep disturbance. The patient is not nervous/anxious.        Objective:   Physical Exam  Constitutional: He is oriented to person, place, and time. He appears well-developed and well-nourished.  HENT:  Head: Normocephalic and atraumatic.  Mouth/Throat: Oropharynx is clear and moist. No oropharyngeal exudate.  Eyes: Conjunctivae and EOM are normal. Right eye exhibits no discharge. Left eye exhibits no discharge. No scleral icterus.  Neck: Normal range of motion. Neck supple.  Cardiovascular: Normal rate and regular rhythm.  Pulmonary/Chest: Effort normal. No respiratory distress. He has no wheezes.  Abdominal: Soft. Bowel sounds are normal. He exhibits no distension.  Musculoskeletal: Normal range of motion. He exhibits no edema or tenderness.  Lymphadenopathy:    He has no cervical adenopathy.  Neurological: He is alert and oriented to person, place, and time. Coordination normal.  Skin: Skin is warm and dry. No rash noted. No erythema. No pallor.  Psychiatric: He has a normal mood and affect. His behavior is normal. Judgment and thought content normal.  Nursing note and vitals reviewed.         Assessment & Plan:   HIV disease: Continue BIKTARVY, check VL today and recheck labs in 4  months   Neutropenia: maybe benign ethnic neutropenia  Smoking: he endorses smoking marijuana and on weekends also "black and tans"   Rectal gonorrhea: recheck OP and GC   I spent greater than 25 minutes with the patient including greater than 50% of time in face to face counsel of the patient re viral blips, PrEP for friends who do not have HIV, re his neutropenia and in coordination of his care.

## 2017-07-12 LAB — CYTOLOGY, (ORAL, ANAL, URETHRAL) ANCILLARY ONLY
Chlamydia: NEGATIVE
Chlamydia: NEGATIVE
Neisseria Gonorrhea: NEGATIVE
Neisseria Gonorrhea: POSITIVE — AB

## 2017-07-13 ENCOUNTER — Telehealth: Payer: Self-pay | Admitting: Infectious Disease

## 2017-07-13 LAB — HIV-1 RNA QUANT-NO REFLEX-BLD
HIV 1 RNA Quant: 53 copies/mL — ABNORMAL HIGH
HIV-1 RNA QUANT, LOG: 1.72 {Log_copies}/mL — AB

## 2017-07-13 NOTE — Telephone Encounter (Signed)
[facility]  and azithromycin 1 gram partners need to be tested and treated and also referred to PrEP clinic

## 2017-07-13 NOTE — Telephone Encounter (Signed)
Left message on patient's voicemail to call the office.  Laurell Josephsammy K Taite Schoeppner, RN

## 2017-07-16 ENCOUNTER — Ambulatory Visit (INDEPENDENT_AMBULATORY_CARE_PROVIDER_SITE_OTHER): Payer: BLUE CROSS/BLUE SHIELD | Admitting: Behavioral Health

## 2017-07-16 DIAGNOSIS — A64 Unspecified sexually transmitted disease: Secondary | ICD-10-CM

## 2017-07-16 DIAGNOSIS — A549 Gonococcal infection, unspecified: Secondary | ICD-10-CM

## 2017-07-16 MED ORDER — CEFTRIAXONE SODIUM 250 MG IJ SOLR
250.0000 mg | Freq: Once | INTRAMUSCULAR | Status: AC
  Administered 2017-07-16: 250 mg via INTRAMUSCULAR

## 2017-07-16 MED ORDER — AZITHROMYCIN 250 MG PO TABS
1000.0000 mg | ORAL_TABLET | Freq: Once | ORAL | Status: AC
  Administered 2017-07-16: 1000 mg via ORAL

## 2017-07-16 NOTE — Telephone Encounter (Addendum)
Left message on patient's voicemail to call the office. Angeline SlimAshley Makeyla Govan RN

## 2017-07-16 NOTE — Telephone Encounter (Signed)
Patient coming for treatment today 07/16/17.

## 2017-07-16 NOTE — Progress Notes (Signed)
Patient came in today to be treated for Gonorrhea.  Patient tolerated medications well.  Offered condoms and patient refused stating he has condoms at home already.   Also patient is aware to refrain from sexual activity for 10 days.  Patient verbalized understanding. Angeline SlimAshley Kieon Lawhorn RN

## 2017-08-06 MED FILL — BIKTARVY 50-200-25 MG TABS: 50-200-25 | 30 days supply | Qty: 30 | Fill #8

## 2017-09-05 MED FILL — BIKTARVY 50-200-25 MG TABS: 50-200-25 | 30 days supply | Qty: 30 | Fill #9

## 2017-10-03 ENCOUNTER — Ambulatory Visit (INDEPENDENT_AMBULATORY_CARE_PROVIDER_SITE_OTHER): Payer: BLUE CROSS/BLUE SHIELD | Admitting: *Deleted

## 2017-10-03 DIAGNOSIS — Z23 Encounter for immunization: Secondary | ICD-10-CM | POA: Diagnosis not present

## 2017-10-03 DIAGNOSIS — B20 Human immunodeficiency virus [HIV] disease: Secondary | ICD-10-CM

## 2017-10-29 ENCOUNTER — Other Ambulatory Visit: Payer: BLUE CROSS/BLUE SHIELD

## 2017-10-29 ENCOUNTER — Other Ambulatory Visit (HOSPITAL_COMMUNITY)
Admission: RE | Admit: 2017-10-29 | Discharge: 2017-10-29 | Disposition: A | Discharge status: Home / Self Care | Payer: BLUE CROSS/BLUE SHIELD | Source: Ambulatory Visit | Attending: Infectious Disease | Admitting: Infectious Disease

## 2017-10-29 DIAGNOSIS — B20 Human immunodeficiency virus [HIV] disease: Secondary | ICD-10-CM | POA: Diagnosis not present

## 2017-10-29 DIAGNOSIS — F17200 Nicotine dependence, unspecified, uncomplicated: Secondary | ICD-10-CM | POA: Diagnosis not present

## 2017-10-29 DIAGNOSIS — A549 Gonococcal infection, unspecified: Secondary | ICD-10-CM | POA: Diagnosis not present

## 2017-10-29 DIAGNOSIS — Z113 Encounter for screening for infections with a predominantly sexual mode of transmission: Secondary | ICD-10-CM

## 2017-10-29 DIAGNOSIS — Z79899 Other long term (current) drug therapy: Secondary | ICD-10-CM | POA: Diagnosis not present

## 2017-10-29 MED FILL — BIKTARVY 50-200-25 MG TABS: 50-200-25 | 30 days supply | Qty: 30 | Fill #10

## 2017-10-30 LAB — URINE CYTOLOGY ANCILLARY ONLY
Chlamydia: NEGATIVE
Neisseria Gonorrhea: NEGATIVE

## 2017-10-30 LAB — T-HELPER CELL (CD4) - (RCID CLINIC ONLY)
CD4 % Helper T Cell: 31 % — ABNORMAL LOW (ref 33–55)
CD4 T CELL ABS: 690 /uL (ref 400–2700)

## 2017-10-31 LAB — HIV-1 RNA QUANT-NO REFLEX-BLD
HIV 1 RNA Quant: 22 copies/mL — ABNORMAL HIGH
HIV-1 RNA Quant, Log: 1.34 Log copies/mL — ABNORMAL HIGH

## 2017-10-31 LAB — COMPLETE METABOLIC PANEL WITH GFR
AG Ratio: 1.6 (calc) (ref 1.0–2.5)
ALT: 10 U/L (ref 9–46)
AST: 15 U/L (ref 10–40)
Albumin: 4.5 g/dL (ref 3.6–5.1)
Alkaline phosphatase (APISO): 34 U/L — ABNORMAL LOW (ref 40–115)
BUN: 14 mg/dL (ref 7–25)
CALCIUM: 10 mg/dL (ref 8.6–10.3)
CO2: 31 mmol/L (ref 20–32)
CREATININE: 1.11 mg/dL (ref 0.60–1.35)
Chloride: 103 mmol/L (ref 98–110)
GFR, EST AFRICAN AMERICAN: 108 mL/min/{1.73_m2} (ref >=60)
GFR, EST NON AFRICAN AMERICAN: 93 mL/min/{1.73_m2} (ref >=60)
GLOBULIN: 2.8 g/dL (ref 1.9–3.7)
GLUCOSE: 94 mg/dL (ref 65–99)
Potassium: 4.3 mmol/L (ref 3.5–5.3)
Sodium: 138 mmol/L (ref 135–146)
TOTAL PROTEIN: 7.3 g/dL (ref 6.1–8.1)
Total Bilirubin: 1 mg/dL (ref 0.2–1.2)

## 2017-10-31 LAB — CBC WITH DIFFERENTIAL/PLATELET
BASOS ABS: 41 {cells}/uL (ref 0–200)
Basophils Relative: 1.1 %
EOS PCT: 5.3 %
Eosinophils Absolute: 196 cells/uL (ref 15–500)
HCT: 42.8 % (ref 38.5–50.0)
Hemoglobin: 14.3 g/dL (ref 13.2–17.1)
Lymphs Abs: 2068 cells/uL (ref 850–3900)
MCH: 29 pg (ref 27.0–33.0)
MCHC: 33.4 g/dL (ref 32.0–36.0)
MCV: 86.8 fL (ref 80.0–100.0)
MONOS PCT: 10.7 %
MPV: 9 fL (ref 7.5–12.5)
NEUTROS PCT: 27 %
Neutro Abs: 999 cells/uL — ABNORMAL LOW (ref 1500–7800)
PLATELETS: 299 10*3/uL (ref 140–400)
RBC: 4.93 10*6/uL (ref 4.20–5.80)
RDW: 12.7 % (ref 11.0–15.0)
Total Lymphocyte: 55.9 %
WBC mixed population: 396 cells/uL (ref 200–950)
WBC: 3.7 10*3/uL — ABNORMAL LOW (ref 3.8–10.8)

## 2017-10-31 LAB — LIPID PANEL
CHOL/HDL RATIO: 3.3 (calc) (ref <5.0)
Cholesterol: 140 mg/dL (ref <200)
HDL: 42 mg/dL (ref >40)
LDL Cholesterol (Calc): 86 mg/dL (calc)
NON-HDL CHOLESTEROL (CALC): 98 mg/dL (ref <130)
TRIGLYCERIDES: 50 mg/dL (ref <150)

## 2017-10-31 LAB — MICROALBUMIN / CREATININE URINE RATIO
Creatinine, Urine: 272 mg/dL (ref 20–320)
Microalb Creat Ratio: 1 mcg/mg creat (ref <30)
Microalb, Ur: 0.3 mg/dL

## 2017-10-31 LAB — RPR: RPR Ser Ql: NONREACTIVE

## 2017-11-06 ENCOUNTER — Encounter (HOSPITAL_COMMUNITY): Payer: Self-pay | Admitting: Emergency Medicine

## 2017-11-06 ENCOUNTER — Emergency Department (HOSPITAL_COMMUNITY)
Admission: EM | Admit: 2017-11-06 | Discharge: 2017-11-07 | Disposition: A | Discharge status: Home / Self Care | Payer: BLUE CROSS/BLUE SHIELD | Attending: Emergency Medicine | Admitting: Emergency Medicine

## 2017-11-06 ENCOUNTER — Other Ambulatory Visit: Payer: Self-pay

## 2017-11-06 DIAGNOSIS — Z21 Asymptomatic human immunodeficiency virus [HIV] infection status: Secondary | ICD-10-CM | POA: Diagnosis not present

## 2017-11-06 DIAGNOSIS — J069 Acute upper respiratory infection, unspecified: Secondary | ICD-10-CM | POA: Insufficient documentation

## 2017-11-06 DIAGNOSIS — R000 Tachycardia, unspecified: Secondary | ICD-10-CM | POA: Diagnosis not present

## 2017-11-06 DIAGNOSIS — Z79899 Other long term (current) drug therapy: Secondary | ICD-10-CM | POA: Insufficient documentation

## 2017-11-06 DIAGNOSIS — B9789 Other viral agents as the cause of diseases classified elsewhere: Secondary | ICD-10-CM | POA: Diagnosis not present

## 2017-11-06 DIAGNOSIS — R509 Fever, unspecified: Secondary | ICD-10-CM

## 2017-11-06 DIAGNOSIS — M7918 Myalgia, other site: Secondary | ICD-10-CM | POA: Diagnosis not present

## 2017-11-06 LAB — CBC WITH DIFFERENTIAL/PLATELET
ABS IMMATURE GRANULOCYTES: 0.02 10*3/uL (ref 0.00–0.07)
BASOS PCT: 1 %
Basophils Absolute: 0.1 10*3/uL (ref 0.0–0.1)
Eosinophils Absolute: 0.1 10*3/uL (ref 0.0–0.5)
Eosinophils Relative: 1 %
HCT: 46.3 % (ref 39.0–52.0)
HEMOGLOBIN: 15 g/dL (ref 13.0–17.0)
IMMATURE GRANULOCYTES: 0 %
LYMPHS PCT: 10 %
Lymphs Abs: 0.8 10*3/uL (ref 0.7–4.0)
MCH: 28.7 pg (ref 26.0–34.0)
MCHC: 32.4 g/dL (ref 30.0–36.0)
MCV: 88.5 fL (ref 80.0–100.0)
MONOS PCT: 11 %
Monocytes Absolute: 0.8 10*3/uL (ref 0.1–1.0)
NEUTROS ABS: 6 10*3/uL (ref 1.7–7.7)
NEUTROS PCT: 77 %
PLATELETS: 311 10*3/uL (ref 150–400)
RBC: 5.23 MIL/uL (ref 4.22–5.81)
RDW: 12.5 % (ref 11.5–15.5)
WBC: 7.7 10*3/uL (ref 4.0–10.5)
nRBC: 0 % (ref 0.0–0.2)

## 2017-11-06 LAB — COMPREHENSIVE METABOLIC PANEL
ALBUMIN: 4.2 g/dL (ref 3.5–5.0)
ALT: 17 U/L (ref 0–44)
ANION GAP: 11 (ref 5–15)
AST: 19 U/L (ref 15–41)
Alkaline Phosphatase: 34 U/L — ABNORMAL LOW (ref 38–126)
BUN: 9 mg/dL (ref 6–20)
CHLORIDE: 105 mmol/L (ref 98–111)
CO2: 23 mmol/L (ref 22–32)
Calcium: 9.4 mg/dL (ref 8.9–10.3)
Creatinine, Ser: 1.11 mg/dL (ref 0.61–1.24)
GFR calc Af Amer: >60 mL/min (ref >60)
GFR calc non Af Amer: >60 mL/min (ref >60)
GLUCOSE: 92 mg/dL (ref 70–99)
POTASSIUM: 4 mmol/L (ref 3.5–5.1)
SODIUM: 139 mmol/L (ref 135–145)
Total Bilirubin: 1.5 mg/dL — ABNORMAL HIGH (ref 0.3–1.2)
Total Protein: 7.8 g/dL (ref 6.5–8.1)

## 2017-11-06 MED ORDER — IBUPROFEN 800 MG PO TABS
800.0000 mg | ORAL_TABLET | Freq: Once | ORAL | Status: AC
  Administered 2017-11-07: 800 mg via ORAL
  Filled 2017-11-06: qty 1

## 2017-11-06 MED ORDER — SODIUM CHLORIDE 0.9 % IV BOLUS
1000.0000 mL | Freq: Once | INTRAVENOUS | Status: AC
  Administered 2017-11-07: 1000 mL via INTRAVENOUS

## 2017-11-06 NOTE — ED Triage Notes (Signed)
Pt from home with c/o generalized body aches and fever. Pt states he has intermittent dry cough and sinus congestion. Pt has hx of HIV and was recently told he may have neurtorpenia.  Pt has temperature of 100.1. Pt has been taking robitussin at home

## 2017-11-06 NOTE — ED Provider Notes (Signed)
[facility]-EMERGENCY DEPT Provider Note   CSN: [id number] Arrival date & time: 11/06/17  1952     History   Chief Complaint Chief Complaint  Patient presents with  . Generalized Body Aches  . Fever    HPI Arthur Nguyen is a 22 y.o. male.  The history is provided by the patient and medical records.  Fever   Associated symptoms include congestion and cough.     22 year old male with history of HIV, last CD4 count 690 on 10/29/2017, presenting to the ED with generalized body aches, cough, and does report one episode of nonbloody, nonbilious emesis prior to arrival.  Cough was productive this morning but has since become dry.  Does report generalized body aches, more so in his back.  No diarrhea.  No chest pain or shortness of breath.  Reports he has been compliant with his HIV medications.  He does attend college and has had multiple possible sick contacts.  He did not get a flu vaccine this year.  He has been taking over-the-counter Robitussin at home.  Past Medical History:  Diagnosis Date  . Acute HIV infection (HCC) 12/15/2016  . Gonorrhea 03/29/2017  . 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 Smoker 07/11/2017    Patient Active Problem List   Diagnosis Date Noted  . Smoker 07/11/2017  . 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 Gonorrhea 03/29/2017  . HIV disease (HCC) 12/15/2016    History reviewed. No pertinent surgical history.      Home Medications    Prior to Admission medications   Medication Sig Start Date End Date Taking? Authorizing Provider  bictegravir-emtricitabine-tenofovir AF (BIKTARVY) 50-200-25 MG TABS tablet Take 1 tablet by mouth daily. 12/15/16   Randall Hiss, MD    Family History No family history on file.  Social History Social History   Tobacco Use  . Smoking status: Never Smoker  . Smokeless tobacco: Never Used  Substance Use Topics  . Alcohol use: Yes    Alcohol/week: 1.0 standard drinks    Types: 1 Cans of beer  per week    Comment: rarely  . Drug use: Never     Allergies   Patient has no known allergies.   Review of Systems Review of Systems  Constitutional: Positive for fever.  HENT: Positive for congestion.   Respiratory: Positive for cough.   Musculoskeletal: Positive for myalgias.  All other systems reviewed and are negative.    Physical Exam Updated Vital Signs BP (!) 156/88 (BP Location: Left Arm)   Pulse (!) 104   Temp 100.1 F (37.8 C) (Oral)   Resp (!) 21   SpO2 98%   Physical Exam  Constitutional: He is oriented to person, place, and time. He appears well-developed and well-nourished.  Feels warm to touch, appears to feel unwell but non-toxic  HENT:  Head: Normocephalic and atraumatic.  Right Ear: Tympanic membrane and ear canal normal.  Left Ear: Tympanic membrane and ear canal normal.  Nose: Mucosal edema and rhinorrhea (clear) present.  Mouth/Throat: Uvula is midline, oropharynx is clear and moist and mucous membranes are normal.  Dry lips and mucous membranes  Eyes: Pupils are equal, round, and reactive to light. Conjunctivae and EOM are normal.  Neck: Normal range of motion.  Cardiovascular: Regular rhythm and normal heart sounds. Tachycardia present.  Low grade tachycardia  Pulmonary/Chest: Effort normal and breath sounds normal. No stridor. No respiratory distress. He has no wheezes. He has no rhonchi.  Dry cough, lungs overall clear  Abdominal: Soft. Bowel sounds are normal. There is no tenderness. There is no rebound.  Musculoskeletal: Normal range of motion.  Neurological: He is alert and oriented to person, place, and time.  Skin: Skin is warm and dry.  Psychiatric: He has a normal mood and affect.  Nursing note and vitals reviewed.    ED Treatments / Results  Labs (all labs ordered are listed, but only abnormal results are displayed) Labs Reviewed  COMPREHENSIVE METABOLIC PANEL - Abnormal; Notable for the following components:      Result  Value   Alkaline Phosphatase 34 (*)    Total Bilirubin 1.5 (*)    All other components within normal limits  URINALYSIS, ROUTINE W REFLEX MICROSCOPIC - Abnormal; Notable for the following components:   Ketones, ur 80 (*)    All other components within normal limits  CULTURE, BLOOD (ROUTINE X 2)  CULTURE, BLOOD (ROUTINE X 2)  URINE CULTURE  CBC WITH DIFFERENTIAL/PLATELET  INFLUENZA PANEL BY PCR (TYPE A & B)  I-STAT CG4 LACTIC ACID, ED  I-STAT CG4 LACTIC ACID, ED    EKG None  Radiology Dg Chest 2 View  Result Date: 11/07/2017 CLINICAL DATA:  Fever.  HIV. EXAM: CHEST - 2 VIEW COMPARISON:  12/04/2016 FINDINGS: The heart size and mediastinal contours are within normal limits. Both lungs are clear. The visualized skeletal structures are unremarkable. IMPRESSION: Clear lungs. Electronically Signed   By: Deatra Robinson M.D.   On: 11/07/2017 00:14    Procedures Procedures (including critical care time)  Medications Ordered in ED Medications  sodium chloride 0.9 % bolus 1,000 mL (0 mLs Intravenous Stopped 11/07/17 0140)  ibuprofen (ADVIL,MOTRIN) tablet 800 mg (800 mg Oral Given 11/07/17 0011)     Initial Impression / Assessment and Plan / ED Course  I have reviewed the triage vital signs and the nursing notes.  Pertinent labs & imaging results that were available during my care of the patient were reviewed by me and considered in my medical decision making (see chart for details).  22 year old male with history of HIV with last CD4 count 690 on 10/29/2017, presenting to the ED with URI type symptoms that began yesterday.  He is febrile here with mild tachycardia but nontoxic in appearance.  Does have nasal congestion with clear rhinorrhea as well as dry cough.  Lungs are overall clear without any wheezes or rhonchi.   Does have history of neutropenia, however labs here are overall reassuring with a normal white blood cell count.  Lactate is also normal.  Blood and urine cultures have been  sent.  UA without any signs of infection.  Flu swab also negative for A&B.  Chest x-ray is clear.  Patient has responded well to Motrin for fever control as well as IV fluids.  His vitals are now within normal limits.  Suspect this is likely viral process.  We will plan to discharge home with symptomatic care.  He was advised that he will be notified if blood cultures are positive and no further intervention required.  He can follow-up closely with his infectious disease physician, Dr. Daiva Eves.  He will return here for any new/acute changes.  Final Clinical Impressions(s) / ED Diagnoses   Final diagnoses:  Viral URI with cough  Fever, unspecified fever cause    ED Discharge Orders         Ordered    benzonatate (TESSALON) 100 MG capsule  Every 8 hours     11/07/17 0314    ibuprofen (  ADVIL,MOTRIN) 800 MG tablet  3 times daily     11/07/17 0314    ondansetron (ZOFRAN ODT) 4 MG disintegrating tablet  Every 8 hours PRN     11/07/17 0314           Garlon Hatchet, PA-C 11/07/17 0316    Dione Booze, MD 11/07/17 [phone]

## 2017-11-07 ENCOUNTER — Emergency Department (HOSPITAL_COMMUNITY): Payer: BLUE CROSS/BLUE SHIELD

## 2017-11-07 DIAGNOSIS — R509 Fever, unspecified: Secondary | ICD-10-CM | POA: Diagnosis not present

## 2017-11-07 LAB — URINALYSIS, ROUTINE W REFLEX MICROSCOPIC
BILIRUBIN URINE: NEGATIVE
Glucose, UA: NEGATIVE mg/dL
HGB URINE DIPSTICK: NEGATIVE
Ketones, ur: 80 mg/dL — AB
Leukocytes, UA: NEGATIVE
NITRITE: NEGATIVE
Protein, ur: NEGATIVE mg/dL
SPECIFIC GRAVITY, URINE: 1.025 (ref 1.005–1.030)
pH: 8 (ref 5.0–8.0)

## 2017-11-07 LAB — INFLUENZA PANEL BY PCR (TYPE A & B)
INFLAPCR: NEGATIVE
Influenza B By PCR: NEGATIVE

## 2017-11-07 LAB — I-STAT CG4 LACTIC ACID, ED: LACTIC ACID, VENOUS: 1.38 mmol/L (ref 0.5–1.9)

## 2017-11-07 MED ORDER — ONDANSETRON 4 MG PO TBDP: 4.0000 mg | ORAL_TABLET | Freq: Three times a day (TID) | ORAL | 0 refills | Status: AC | PRN

## 2017-11-07 MED ORDER — IBUPROFEN 800 MG PO TABS: 800.0000 mg | ORAL_TABLET | Freq: Three times a day (TID) | ORAL | 0 refills | Status: AC

## 2017-11-07 MED ORDER — BENZONATATE 100 MG PO CAPS: 100.0000 mg | ORAL_CAPSULE | Freq: Three times a day (TID) | ORAL | 0 refills | Status: AC

## 2017-11-07 NOTE — Discharge Instructions (Signed)
Take the prescribed medication as directed.  Make sure to rest and drink fluids. Follow-up with Dr. Daiva Eves. Return to the ED for new or worsening symptoms--uncontrolled fever, shortness of breath, coughing up blood, etc.

## 2017-11-08 LAB — URINE CULTURE: Culture: NO GROWTH

## 2017-11-12 ENCOUNTER — Other Ambulatory Visit (HOSPITAL_COMMUNITY)
Admission: RE | Admit: 2017-11-12 | Discharge: 2017-11-12 | Disposition: A | Discharge status: Home / Self Care | Payer: BLUE CROSS/BLUE SHIELD | Source: Ambulatory Visit | Attending: Infectious Disease | Admitting: Infectious Disease

## 2017-11-12 ENCOUNTER — Encounter: Payer: Self-pay | Admitting: Infectious Disease

## 2017-11-12 ENCOUNTER — Ambulatory Visit (INDEPENDENT_AMBULATORY_CARE_PROVIDER_SITE_OTHER): Payer: BLUE CROSS/BLUE SHIELD | Admitting: Infectious Disease

## 2017-11-12 VITALS — BP 118/78 | HR 92 | Temp 97.9°F | Wt 157.0 lb

## 2017-11-12 DIAGNOSIS — B20 Human immunodeficiency virus [HIV] disease: Secondary | ICD-10-CM | POA: Diagnosis not present

## 2017-11-12 DIAGNOSIS — Z23 Encounter for immunization: Secondary | ICD-10-CM | POA: Diagnosis not present

## 2017-11-12 DIAGNOSIS — A549 Gonococcal infection, unspecified: Secondary | ICD-10-CM

## 2017-11-12 DIAGNOSIS — D708 Other neutropenia: Secondary | ICD-10-CM | POA: Diagnosis not present

## 2017-11-12 MED ORDER — BICTEGRAVIR-EMTRICITAB-TENOFOV 50-200-25 MG PO TABS: 1.0000 | ORAL_TABLET | Freq: Every day | ORAL | 11 refills | Status: DC

## 2017-11-12 NOTE — Progress Notes (Signed)
Chief complaint: Follow-up for HIV on medications   Subjective:    Patient ID: Arthur Nguyen, male    DOB: Dec 08, 1995, 22 y.o.   MRN: [id number]  HPI  Arthur Nguyen is a 22year old [place] man who was diagnosed with Acute HIV infection December 2018 when he also had rectal gonorrhea.  He had presented to ED at [facility] with flu like symptoms of malaise, fevers, chills, emesis, constipation, sore throat, worsening abdominal pain. He had been seen and checked for flu and mono which were both negative prior to being seen at ED. ED did further workup and tested him for HIV via rapid test which was negative and full fledged 4th gen assay which was +. Unfortunately the turnaround to 3rd step of the back late and we could not bring him into the I reached out to him yesterday. 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  Labs came back with wild-type virus with no significant and RTI NR NNRTI PIR integrase resistance mutations.  Tolerated the BIKTARVY very well with no symptoms whatsoever.  His viral load has dropped dramatically from 5,750,000 copies to 460 copies and less than a month then down to 54 copies.  Have some low-level virus but not of concern.  Would like to see it less than 20 consistently but again these could all be blips.  He is getting medications filled there was a long outpatient pharmacy consistently.  He does have some neutropenia which may be a genetic ethnic variant that is not active his life significantly.  Though he did recently have a trip to the emergency room where he was evaluated for upper respiratory tract infection.  He improved with symptomatic management without antibiotics.  I did caution him that he though with his low neutrophil count may at times be at risk for more serious infections then the average patient and that he  should always counsel people evaluating him about the fact that he has low neutrophils.  He tested positive for positive for gonorrhea via oral swab last time we are here and we treat him for that we will recheck in for these STIs today.     Lab Results  Component Value Date   HIV1RNAQUANT 22 (H) 10/29/2017   HIV1RNAQUANT 53 (H) 07/11/2017   HIV1RNAQUANT 99 (H) 06/20/2017   Lab Results  Component Value Date   CD4TABS 690 10/29/2017   CD4TABS 710 06/20/2017   CD4TABS 820 01/08/2017    We reviewed his labs and noticed his ANC has been low, not  Terribly low but below normal range every time we have checkec it. He states that he typically suffers from sinus infection every 2 months.   Social History   Socioeconomic History  . Marital status: Single    Spouse name: Not on file  . Number of children: Not on file  . Years of education: Not on file  . Highest education level: Not on file  Occupational History  . Not on file  Social Needs  . Financial resource strain: Not on file  . Food insecurity:    Worry: Not on file    Inability: Not on file  . Transportation needs:    Medical: Not on file    Non-medical: Not on file  Tobacco Use  . Smoking status: Never Smoker  . Smokeless tobacco: Never Used  Substance and Sexual Activity  . Alcohol use: Yes    Alcohol/week: 1.0 standard drinks    Types: 1 Cans of beer per week    Comment: rarely  . Drug use: Never  . Sexual activity: Yes    Partners: Male    Birth control/protection: Condom    Comment: given condoms  Lifestyle  . Physical activity:    Days per week: Not on file    Minutes per session: Not on file  . Stress: Not on file  Relationships  . Social connections:    Talks on phone: Not on file    Gets together: Not on file    Attends religious service: Not on file    Active member of club or organization: Not on file    Attends meetings of clubs or organizations: Not on file    Relationship status: Not on  file  Other Topics Concern  . Not on file  Social History Narrative  . Not on file    No Known Allergies   Current Outpatient Medications:  .  bictegravir-emtricitabine-tenofovir AF (BIKTARVY) 50-200-25 MG TABS tablet, Take 1 tablet by mouth daily., Disp: 30 tablet, Rfl: 11 .  benzonatate (TESSALON) 100 MG capsule, Take 1 capsule (100 mg total) by mouth every 8 (eight) hours. (Patient not taking: Reported on 11/12/2017), Disp: 21 capsule, Rfl: 0 .  guaiFENesin (ROBITUSSIN) 100 MG/5ML SOLN, Take 15 mLs by mouth every 4 (four) hours as needed for cough or to loosen phlegm., Disp: , Rfl:  .  ibuprofen (ADVIL,MOTRIN) 800 MG tablet, Take 1 tablet (800 mg total) by mouth 3 (three) times daily. (Patient not taking: Reported on 11/12/2017), Disp: 21 tablet, Rfl: 0 .  ondansetron (ZOFRAN ODT) 4 MG disintegrating tablet, Take 1 tablet (4 mg total) by mouth every 8 (eight) hours as needed for nausea. (Patient not taking: Reported on 11/12/2017), Disp: 10 tablet, Rfl: 0   Review of Systems  Constitutional: Negative for activity change, appetite change, chills, diaphoresis, fatigue, fever and unexpected weight change.  HENT: Negative for congestion, rhinorrhea, sinus pressure, sneezing, sore throat and trouble swallowing.   Eyes: Negative for photophobia and visual disturbance.  Respiratory: Negative for cough, chest tightness, shortness of breath, wheezing and stridor.   Cardiovascular: Negative for chest pain, palpitations and leg swelling.  Gastrointestinal: Negative for abdominal distention, abdominal pain, anal bleeding, blood in stool, constipation, diarrhea, nausea and vomiting.  Genitourinary: Negative for difficulty urinating, dysuria, flank pain and hematuria.  Musculoskeletal: Negative for arthralgias, back pain, gait problem, joint swelling and myalgias.  Skin: Negative for color change, pallor, rash and wound.  Neurological: Negative for dizziness, tremors, weakness and light-headedness.    Hematological: Negative for adenopathy. Does not bruise/bleed easily.  Psychiatric/Behavioral: Negative for agitation, behavioral problems, confusion, decreased concentration, dysphoric mood and sleep disturbance. The patient is not nervous/anxious.        Objective:   Physical Exam  Constitutional: He is oriented to person, place, and time. He appears well-developed and well-nourished.  HENT:  Head: Normocephalic and atraumatic.  Mouth/Throat: Oropharynx is clear and moist. No oropharyngeal exudate.  Eyes: Conjunctivae and EOM are normal. Right eye exhibits no discharge. Left eye exhibits no discharge. No scleral icterus.  Neck: Normal range of motion. Neck supple.  Cardiovascular: Normal rate and regular rhythm.  Pulmonary/Chest: Effort normal. No respiratory distress. He has no wheezes.  Abdominal: Soft. Bowel sounds are normal. He exhibits no distension.  Musculoskeletal: Normal range of motion. He exhibits no edema or tenderness.  Lymphadenopathy:    He has no cervical adenopathy.  Neurological: He is alert and oriented to person, place, and time. Coordination normal.  Skin: Skin is warm and dry. No rash noted. No erythema. No pallor.  Psychiatric: He has a normal mood and affect. His behavior is normal. Judgment and thought content normal.  Nursing note and vitals reviewed.         Assessment & Plan:   HIV disease: Continue BIKTARVY, check VL today and recheck labs in 6 months   Neutropenia: maybe benign ethnic neutropenia  Smoking: he endorses smoking marijuana and on weekends also "black and tans"   Rectal or oral pharyngeal gonorrhea: recheck OP and GC  . I spent greater than 25 minutes with the patient including greater than 50% of time in face to face counsel of the patient his HIV regimen different possible alternative med regimen screening for STIs and need for vaccinations and in coordination of their care.

## 2017-11-13 LAB — URINE CYTOLOGY ANCILLARY ONLY
CHLAMYDIA, DNA PROBE: NEGATIVE
NEISSERIA GONORRHEA: NEGATIVE

## 2017-11-13 LAB — CULTURE, BLOOD (ROUTINE X 2)
Culture: NO GROWTH
SPECIAL REQUESTS: ADEQUATE

## 2017-11-13 LAB — CYTOLOGY, (ORAL, ANAL, URETHRAL) ANCILLARY ONLY
CHLAMYDIA, DNA PROBE: NEGATIVE
Chlamydia: NEGATIVE
NEISSERIA GONORRHEA: NEGATIVE
NEISSERIA GONORRHEA: NEGATIVE

## 2017-11-23 MED FILL — BIKTARVY 50-200-25 MG TABS: 50-200-25 | 30 days supply | Qty: 30 | Fill #11

## 2017-12-19 MED FILL — BIKTARVY 50-200-25 MG TABS: 50-200-25 | 30 days supply | Qty: 30 | Fill #0

## 2018-01-17 MED FILL — BIKTARVY 50-200-25 MG TABS: 50-200-25 | 30 days supply | Qty: 30 | Fill #1

## 2018-02-08 MED FILL — BIKTARVY 50-200-25 MG TABS: 50-200-25 | 30 days supply | Qty: 30 | Fill #2

## 2018-03-13 ENCOUNTER — Telehealth: Payer: Self-pay | Admitting: Pharmacy Technician

## 2018-03-13 MED FILL — BIKTARVY 50-200-25 MG TABS: 50-200-25 | 30 days supply | Qty: 30 | Fill #3

## 2018-03-13 NOTE — Telephone Encounter (Signed)
[facility] Patient Advocate Encounter  Completed and sent [employer] Advancing Access application for Biktarvy for this patient who is insured, but whose insurance will not cover medications.Marland Kitchen    He is approved 03/13/2018 to 03/13/2019  BIN [id number] PCN [id number] [id number] ID [id number]  [facility] pharmacy is aware and will process.   Netty Starring. Dimas Aguas CPhT Specialty Pharmacy Patient [facility] for Infectious Disease Phone: [phone] Fax:  [phone]

## 2018-03-14 ENCOUNTER — Telehealth: Payer: Self-pay | Admitting: Pharmacy Technician

## 2018-03-14 NOTE — Telephone Encounter (Signed)
[facility] Patient Advocate Encounter  Completed and sent [employer] Advancing Access application for Biktarvy for this patient who is uninsured.    He is approved 03/13/2018 to 03/13/2019.  BIN  [id number] PCN  [id number]  GRP  [id number] ID  [id number]  [facility] pharmacy is aware.   Netty Starring. Dimas Aguas CPhT Specialty Pharmacy Patient [facility] for Infectious Disease Phone: [phone] Fax:  [phone]

## 2018-04-15 MED FILL — BIKTARVY 50-200-25 MG TABS: 50-200-25 | 30 days supply | Qty: 30 | Fill #4

## 2018-05-10 MED FILL — BIKTARVY 50-200-25 MG TABS: 50-200-25 | 30 days supply | Qty: 30 | Fill #5

## 2018-05-14 ENCOUNTER — Other Ambulatory Visit: Payer: Self-pay

## 2018-05-14 ENCOUNTER — Other Ambulatory Visit (HOSPITAL_COMMUNITY)
Admission: RE | Admit: 2018-05-14 | Discharge: 2018-05-14 | Disposition: A | Discharge status: Home / Self Care | Payer: BLUE CROSS/BLUE SHIELD | Source: Ambulatory Visit | Attending: Infectious Disease | Admitting: Infectious Disease

## 2018-05-14 ENCOUNTER — Other Ambulatory Visit: Payer: BLUE CROSS/BLUE SHIELD

## 2018-05-14 DIAGNOSIS — B20 Human immunodeficiency virus [HIV] disease: Secondary | ICD-10-CM | POA: Insufficient documentation

## 2018-05-15 LAB — URINE CYTOLOGY ANCILLARY ONLY
Chlamydia: NEGATIVE
Neisseria Gonorrhea: NEGATIVE

## 2018-05-15 LAB — T-HELPER CELL (CD4) - (RCID CLINIC ONLY)
CD4 % Helper T Cell: 29 % — ABNORMAL LOW (ref 33–65)
CD4 T Cell Abs: 585 /uL (ref 400–1790)

## 2018-05-21 LAB — CBC WITH DIFFERENTIAL/PLATELET
Absolute Monocytes: 400 cells/uL (ref 200–950)
Basophils Absolute: 72 cells/uL (ref 0–200)
Basophils Relative: 1.8 %
Eosinophils Absolute: 260 cells/uL (ref 15–500)
Eosinophils Relative: 6.5 %
HCT: 43.5 % (ref 38.5–50.0)
Hemoglobin: 14.5 g/dL (ref 13.2–17.1)
Lymphs Abs: 2172 cells/uL (ref 850–3900)
MCH: 29.3 pg (ref 27.0–33.0)
MCHC: 33.3 g/dL (ref 32.0–36.0)
MCV: 87.9 fL (ref 80.0–100.0)
MPV: 9.2 fL (ref 7.5–12.5)
Monocytes Relative: 10 %
Neutro Abs: 1096 cells/uL — ABNORMAL LOW (ref 1500–7800)
Neutrophils Relative %: 27.4 %
Platelets: 347 10*3/uL (ref 140–400)
RBC: 4.95 10*6/uL (ref 4.20–5.80)
RDW: 13.2 % (ref 11.0–15.0)
Total Lymphocyte: 54.3 %
WBC: 4 10*3/uL (ref 3.8–10.8)

## 2018-05-21 LAB — HIV-1 RNA QUANT-NO REFLEX-BLD
HIV 1 RNA Quant: <20 copies/mL
HIV-1 RNA Quant, Log: <1.3 Log copies/mL

## 2018-05-21 LAB — COMPLETE METABOLIC PANEL WITH GFR
AG Ratio: 1.4 (calc) (ref 1.0–2.5)
ALT: 11 U/L (ref 9–46)
AST: 13 U/L (ref 10–40)
Albumin: 4.4 g/dL (ref 3.6–5.1)
Alkaline phosphatase (APISO): 42 U/L (ref 36–130)
BUN: 10 mg/dL (ref 7–25)
CO2: 30 mmol/L (ref 20–32)
Calcium: 9.5 mg/dL (ref 8.6–10.3)
Chloride: 103 mmol/L (ref 98–110)
Creat: 1.09 mg/dL (ref 0.60–1.35)
GFR, Est African American: 110 mL/min/{1.73_m2} (ref >=60)
GFR, Est Non African American: 94 mL/min/{1.73_m2} (ref >=60)
Globulin: 3.1 g/dL (calc) (ref 1.9–3.7)
Glucose, Bld: 89 mg/dL (ref 65–99)
Potassium: 4.3 mmol/L (ref 3.5–5.3)
Sodium: 139 mmol/L (ref 135–146)
Total Bilirubin: 0.7 mg/dL (ref 0.2–1.2)
Total Protein: 7.5 g/dL (ref 6.1–8.1)

## 2018-05-21 LAB — RPR: RPR Ser Ql: NONREACTIVE

## 2018-05-28 ENCOUNTER — Encounter: Payer: Self-pay | Admitting: Infectious Diseases

## 2018-05-28 ENCOUNTER — Other Ambulatory Visit: Payer: Self-pay

## 2018-05-28 ENCOUNTER — Ambulatory Visit (INDEPENDENT_AMBULATORY_CARE_PROVIDER_SITE_OTHER): Payer: BLUE CROSS/BLUE SHIELD | Admitting: Infectious Diseases

## 2018-05-28 DIAGNOSIS — Z7189 Other specified counseling: Secondary | ICD-10-CM | POA: Diagnosis not present

## 2018-05-28 DIAGNOSIS — Z113 Encounter for screening for infections with a predominantly sexual mode of transmission: Secondary | ICD-10-CM | POA: Diagnosis not present

## 2018-05-28 DIAGNOSIS — B20 Human immunodeficiency virus [HIV] disease: Secondary | ICD-10-CM

## 2018-05-28 DIAGNOSIS — Z7185 Encounter for immunization safety counseling: Secondary | ICD-10-CM | POA: Insufficient documentation

## 2018-05-28 NOTE — Progress Notes (Signed)
Name: Arthur CamperMarkell Hypolite  ZOX:[id number]   DOB: 05-06-1995   PCP: Patient, No Pcp Per    Virtual Visit via Telephone Note  I connected with Arthur Nguyen on 05/28/18 at  1:45 PM EDT by telephone and verified that I am speaking with the correct person using two identifiers.   I discussed the limitations, risks, security and privacy concerns of performing an evaluation and management service by telephone and the availability of in person appointments. I also discussed with the patient that there may be a patient responsible charge related to this service. The patient expressed understanding and agreed to proceed.   Chief Complaint  Patient presents with  . Follow-up    HIV follow up care. no complaints or concerns     History of Present Illness: Arthur Nguyen is a 23 y.o. male with HIV disease, diagnosed in December 2018 in the setting of acute infection.  He was started on Biktarvy right away with excellent result.  He has continued to take this every day without any concern for side effects or access to his medicine.  He is currently looking for a new apartment and working full-time.  [place].  He has not had any significant periods of illness, hospitalizations or ER visits.   He does not have any concerning signs or symptoms for any STDs today.  He has been treated for gonorrhea several times in the past.  Medical/surgical/social/family history have been updated during today's visit.     Observations/Objective: [name] sounds to be in good spirits today on the phone during our discussion.  I do not detect any shortness of breath.  His thought content/mood/judgment all appear intact.  HIV 1 RNA Quant (copies/mL)  Date Value  05/14/2018 <20 NOT DETECTED  10/29/2017 22 (H)  07/11/2017 53 (H)   CD4 T Cell Abs (/uL)  Date Value  05/14/2018 585  10/29/2017 690  06/20/2017 710    Lab Results  Component Value Date    CREATININE 1.09 05/14/2018   CREATININE 1.11 11/06/2017   CREATININE 1.11 10/29/2017    Lab Results  Component Value Date   WBC 4.0 05/14/2018   HGB 14.5 05/14/2018   HCT 43.5 05/14/2018   MCV 87.9 05/14/2018   PLT 347 05/14/2018    Lab Results  Component Value Date   ALT 11 05/14/2018   AST 13 05/14/2018   ALKPHOS 34 (L) 11/06/2017   BILITOT 0.7 05/14/2018     Assessment and Plan: Problem List Items Addressed This Visit      Unprioritized   HIV disease (HCC)    His HIV is under excellent control on Biktarvy.  We will continue this current regimen for him once daily.  He is not taking any medications that would interfere with absorption and specifically no multivitamins. Safe sex counseling discussed over the phone today.  His urine gonorrhea chlamydia were negative recently. He would benefit from regular 3-point STD testing with the health department considering he has been diagnosed with rectal gonorrhea twice in the last 18 months.  We will provide him with condoms tomorrow at his vaccine visit. He can follow with Dr. Daiva [place]vesVan Dam again in 6 months with labs prior to his visit as he prefers.       Relevant Orders   HIV-1 RNA quant-no reflex-bld   T-helper cell (CD4)- ([facility] clinic only)   RPR   Vaccine counseling    He does not have any hepatitis  B immunity.  We will proceed with vaccine series.  I provided counseling on this today.  He will schedule first visit tomorrow morning to receive hep B #1, second dose to follow in 4 weeks.  Final at 57-month follow-up with Dr. Algis Liming. You are vaccine to be given tomorrow as well.       Other Visit Diagnoses    Screening for STDs (sexually transmitted diseases)    -  Primary   Relevant Orders   Urine cytology ancillary only       Follow Up Instructions: Return to clinic in 6 months. Vaccines as discussed above   I discussed the assessment and treatment plan with the patient. The patient was provided an opportunity to ask  questions and all were answered. The patient agreed with the plan and demonstrated an understanding of the instructions.   The patient was advised to call back or seek an in-person evaluation if the symptoms worsen or if the condition fails to improve as anticipated.  I provided 12 minutes of non-face-to-face time during this encounter.   Rexene Alberts, MSN, NP-C [facility] for Infectious Disease [facility]  [email or web address] Pager: [phone] Office: [phone] [facility] Main Line: [phone]

## 2018-05-28 NOTE — Assessment & Plan Note (Signed)
He does not have any hepatitis B immunity.  We will proceed with vaccine series.  I provided counseling on this today.  He will schedule first visit tomorrow morning to receive hep B #1, second dose to follow in 4 weeks.  Final at 2-month follow-up with Dr. Algis Liming. You are vaccine to be given tomorrow as well.

## 2018-05-28 NOTE — Patient Instructions (Signed)
It was very nice to talk to you on the phone today.  I am glad to hear that you are staying well and keeping healthy.    Your medication is working perfectly for you and you are doing a great job taking it as prescribed  Please continue your Biktarvy 1 pill once a day as we discussed.   Vaccines Recommended:   Hepatitis B series, Prevnar  Recommended Next Office Visit:   6 months.  Third hepatitis B vaccine and flu vaccine to be given at that visit.  Please continue to be well, stay away from folks that are sick, wash her hands frequently, do not touch her face or mouth and continue to take your medications.

## 2018-05-28 NOTE — Assessment & Plan Note (Addendum)
His HIV is under excellent control on Biktarvy.  We will continue this current regimen for him once daily.  He is not taking any medications that would interfere with absorption and specifically no multivitamins. Safe sex counseling discussed over the phone today.  His urine gonorrhea chlamydia were negative recently. He would benefit from regular 3-point STD testing with the health department considering he has been diagnosed with rectal gonorrhea twice in the last 18 months.  We will provide him with condoms tomorrow at his vaccine visit. He can follow with Dr. Daiva Eves again in 6 months with labs prior to his visit as he prefers.

## 2018-05-29 ENCOUNTER — Ambulatory Visit: Payer: BLUE CROSS/BLUE SHIELD

## 2018-06-18 MED FILL — BIKTARVY 50-200-25 MG TABS: 50-200-25 | 30 days supply | Qty: 30 | Fill #6

## 2018-07-18 MED FILL — BIKTARVY 50-200-25 MG TABS: 50-200-25 | 30 days supply | Qty: 30 | Fill #7

## 2018-07-25 ENCOUNTER — Telehealth: Payer: Self-pay

## 2018-07-25 ENCOUNTER — Telehealth: Payer: BLUE CROSS/BLUE SHIELD | Admitting: Physician Assistant

## 2018-07-25 ENCOUNTER — Encounter (INDEPENDENT_AMBULATORY_CARE_PROVIDER_SITE_OTHER): Payer: Self-pay

## 2018-07-25 DIAGNOSIS — Z20822 Contact with and (suspected) exposure to covid-19: Secondary | ICD-10-CM

## 2018-07-25 DIAGNOSIS — Z20828 Contact with and (suspected) exposure to other viral communicable diseases: Secondary | ICD-10-CM

## 2018-07-25 NOTE — Telephone Encounter (Signed)
EC Courtesy call - patient report started COVID19 questionnaire today - cough - very little; weakness - just mainly fatigue. Reviewed protocol w/patient. Patient reports he will be going tomorrow, Friday, 07/26/18, to be have [place] testing done @ [facility]. No further questions/concerns.

## 2018-07-25 NOTE — Progress Notes (Signed)
E-Visit for Corona Virus Screening   Your current symptoms could be consistent with the coronavirus.  Many health care providers can now test patients at their office but not all are.  [place] has multiple testing sites. For information on our COVID testing locations and hours go to [email or web address]  Please quarantine yourself while awaiting your test results.  We are enrolling you in our [place] for [place] . Daily you will receive a questionnaire within the [place] website. Our COVID 19 response team willl be monitoriing your responses daily.  I have placed an order for testing. Please review the site above to select a time to go for testing. We will call with results as soon as available    COVID-19 is a respiratory illness with symptoms that are similar to the flu. Symptoms are typically mild to moderate, but there have been cases of severe illness and death due to the virus. The following symptoms may appear 2-14 days after exposure: . Fever . Cough . Shortness of breath or difficulty breathing . Chills . Repeated shaking with chills . Muscle pain . Headache . Sore throat . New loss of taste or smell . Fatigue . Congestion or runny nose . Nausea or vomiting . Diarrhea  It is vitally important that if you feel that you have an infection such as this virus or any other virus that you stay home and away from places where you may spread it to others.  You should self-quarantine for 14 days if you have symptoms that could potentially be coronavirus or have been in close contact a with a person diagnosed with COVID-19 within the last 2 weeks. You should avoid contact with people age 59 and older.   You should wear a mask or cloth face covering over your nose and mouth if you must be around other people or animals, including pets (even at home). Try to stay at least 6 feet away from other people. This will protect the people around  you.  You may also take acetaminophen (Tylenol) as needed for fever.   Reduce your risk of any infection by using the same precautions used for avoiding the common cold or flu:  Marland Kitchen Wash your hands often with soap and warm water for at least 20 seconds.  If soap and water are not readily available, use an alcohol-based hand sanitizer with at least 60% alcohol.  . If coughing or sneezing, cover your mouth and nose by coughing or sneezing into the elbow areas of your shirt or coat, into a tissue or into your sleeve (not your hands). . Avoid shaking hands with others and consider head nods or verbal greetings only. . Avoid touching your eyes, nose, or mouth with unwashed hands.  . Avoid close contact with people who are sick. . Avoid places or events with large numbers of people in one location, like concerts or sporting events. . Carefully consider travel plans you have or are making. . If you are planning any travel outside or inside the [place], visit the CDC's Travelers' Health webpage for the latest health notices. . If you have some symptoms but not all symptoms, continue to monitor at home and seek medical attention if your symptoms worsen. . If you are having a medical emergency, call 911.  HOME CARE . Only take medications as instructed by your medical team. . Drink plenty of fluids and get plenty of rest. . A steam or ultrasonic humidifier can help if you have congestion.  GET HELP RIGHT AWAY IF YOU HAVE EMERGENCY WARNING SIGNS** FOR COVID-19. If you or someone is showing any of these signs seek emergency medical care immediately. Call 911 or proceed to your closest emergency facility if: . You develop worsening high fever. . Trouble breathing . Bluish lips or face . Persistent pain or pressure in the chest . New confusion . Inability to wake or stay awake . You cough up blood. . Your symptoms become more severe  **This list is not all possible symptoms. Contact your medical provider  for any symptoms that are sever or concerning to you.   MAKE SURE YOU   Understand these instructions.  Will watch your condition.  Will get help right away if you are not doing well or get worse.  Your e-visit answers were reviewed by a board certified advanced clinical practitioner to complete your personal care plan.  Depending on the condition, your plan could have included both over the counter or prescription medications.  If there is a problem please reply once you have received a response from your provider.  Your safety is important to us.  If you have drug allergies check your prescription carefully.    You can use MyChart to ask questions about today's visit, request a non-urgent call back, or ask for a work or school excuse for 24 hours related to this e-Visit. If it has been greater than 24 hours you will need to follow up with your provider, or enter a new e-Visit to address those concerns. You will get an e-mail in the next two days asking about your experience.  I hope that your e-visit has been valuable and will speed your recovery. Thank you for using e-visits.

## 2018-07-25 NOTE — Progress Notes (Signed)
I have spent 5 minutes in review of e-visit questionnaire, review and updating patient chart, medical decision making and response to patient.   Virgie Chery Cody Chidera Thivierge, PA-C    

## 2018-07-26 DIAGNOSIS — Z7189 Other specified counseling: Secondary | ICD-10-CM | POA: Diagnosis not present

## 2018-07-26 DIAGNOSIS — Z03818 Encounter for observation for suspected exposure to other biological agents ruled out: Secondary | ICD-10-CM | POA: Diagnosis not present

## 2018-07-27 ENCOUNTER — Encounter (INDEPENDENT_AMBULATORY_CARE_PROVIDER_SITE_OTHER): Payer: Self-pay

## 2018-07-28 ENCOUNTER — Telehealth: Payer: Self-pay

## 2018-07-28 ENCOUNTER — Encounter (INDEPENDENT_AMBULATORY_CARE_PROVIDER_SITE_OTHER): Payer: Self-pay

## 2018-07-28 DIAGNOSIS — Z03818 Encounter for observation for suspected exposure to other biological agents ruled out: Secondary | ICD-10-CM | POA: Diagnosis not present

## 2018-07-28 NOTE — Telephone Encounter (Signed)
Pt stated that his appetite has decreased due to his sore throat. Pt is drinking fluids. Advised pt to do salt water gargles and suck on hard candy or medicated cough drops. Encouraged pt to eat popsicle's and drink fluids.

## 2018-08-19 MED FILL — BIKTARVY 50-200-25 MG TABS: 50-200-25 | 30 days supply | Qty: 30 | Fill #8

## 2018-09-16 MED FILL — BIKTARVY 50-200-25 MG TABS: 50-200-25 | 30 days supply | Qty: 30 | Fill #9

## 2018-10-10 MED FILL — BIKTARVY 50-200-25 MG TABS: 50-200-25 | 30 days supply | Qty: 30 | Fill #10

## 2018-11-07 MED FILL — BIKTARVY 50-200-25 MG TABS: 50-200-25 | 30 days supply | Qty: 30 | Fill #11

## 2018-11-25 ENCOUNTER — Other Ambulatory Visit: Payer: BLUE CROSS/BLUE SHIELD

## 2018-12-03 ENCOUNTER — Other Ambulatory Visit: Payer: Self-pay | Admitting: Infectious Disease

## 2018-12-03 ENCOUNTER — Other Ambulatory Visit: Payer: BC Managed Care – PPO

## 2018-12-03 ENCOUNTER — Other Ambulatory Visit: Payer: Self-pay

## 2018-12-03 DIAGNOSIS — B20 Human immunodeficiency virus [HIV] disease: Secondary | ICD-10-CM

## 2018-12-03 DIAGNOSIS — Z113 Encounter for screening for infections with a predominantly sexual mode of transmission: Secondary | ICD-10-CM | POA: Diagnosis not present

## 2018-12-04 LAB — T-HELPER CELL (CD4) - (RCID CLINIC ONLY)
CD4 % Helper T Cell: 33 % (ref 33–65)
CD4 T Cell Abs: 643 /uL (ref 400–1790)

## 2018-12-06 MED FILL — BIKTARVY 50-200-25 MG TABS: 50-200-25 | 30 days supply | Qty: 30 | Fill #0

## 2018-12-09 ENCOUNTER — Encounter: Payer: BLUE CROSS/BLUE SHIELD | Admitting: Infectious Disease

## 2018-12-10 LAB — RPR: RPR Ser Ql: NONREACTIVE

## 2018-12-10 LAB — HIV-1 RNA QUANT-NO REFLEX-BLD
HIV 1 RNA Quant: <20 copies/mL
HIV-1 RNA Quant, Log: <1.3 Log copies/mL

## 2018-12-15 DIAGNOSIS — Z20828 Contact with and (suspected) exposure to other viral communicable diseases: Secondary | ICD-10-CM | POA: Diagnosis not present

## 2018-12-15 DIAGNOSIS — J209 Acute bronchitis, unspecified: Secondary | ICD-10-CM | POA: Diagnosis not present

## 2018-12-16 ENCOUNTER — Ambulatory Visit: Payer: BC Managed Care – PPO

## 2018-12-16 ENCOUNTER — Encounter: Payer: BC Managed Care – PPO | Admitting: Infectious Disease

## 2018-12-16 NOTE — Telephone Encounter (Signed)
RN reached out to patient. He went to urgent care yesterday 12/13 for evaluation and covid test. He is still waiting for results, but did present with symptoms (shortness of breath on exertion and wheezing are the only ones still unresolved, but he states he feels ok overall today).  He is following their quarantine directions. He will be moving to [place] on 12/22.  He had labs 12/1, is undetectable with CD4 of 643.   He will send [place] a signed release of information once he arrives in [place] from the new provider's office. RN followed up phone call with instructions via MyChart. RN notified [facility] of patient's move. Landis Gandy, RN

## 2018-12-25 ENCOUNTER — Telehealth: Payer: Self-pay | Admitting: Pharmacy Technician

## 2018-12-25 NOTE — Telephone Encounter (Signed)
[facility] Patient Advocate Encounter  Patient is [place] out of [place].  He  requested a 90 day fill of [place] so that he has time to get set up with a clinic there.  I called [facility] and they approved him.  [facility] is processing.  Venida Jarvis. Nadara Mustard [place] [facility] Phone: [phone] Fax:  [phone]

## 2018-12-26 MED FILL — BIKTARVY 50-200-25 MG TABS: 50-200-25 | 30 days supply | Qty: 30 | Fill #1

## 2019-09-10 IMAGING — CR DG CHEST 2V
2 series · 2 of 2 positions shown · non-contrast
Comparison: None.

CLINICAL DATA: 21-year-old male with cough and fever.

EXAM:
CHEST  2 VIEW

[chest pa]
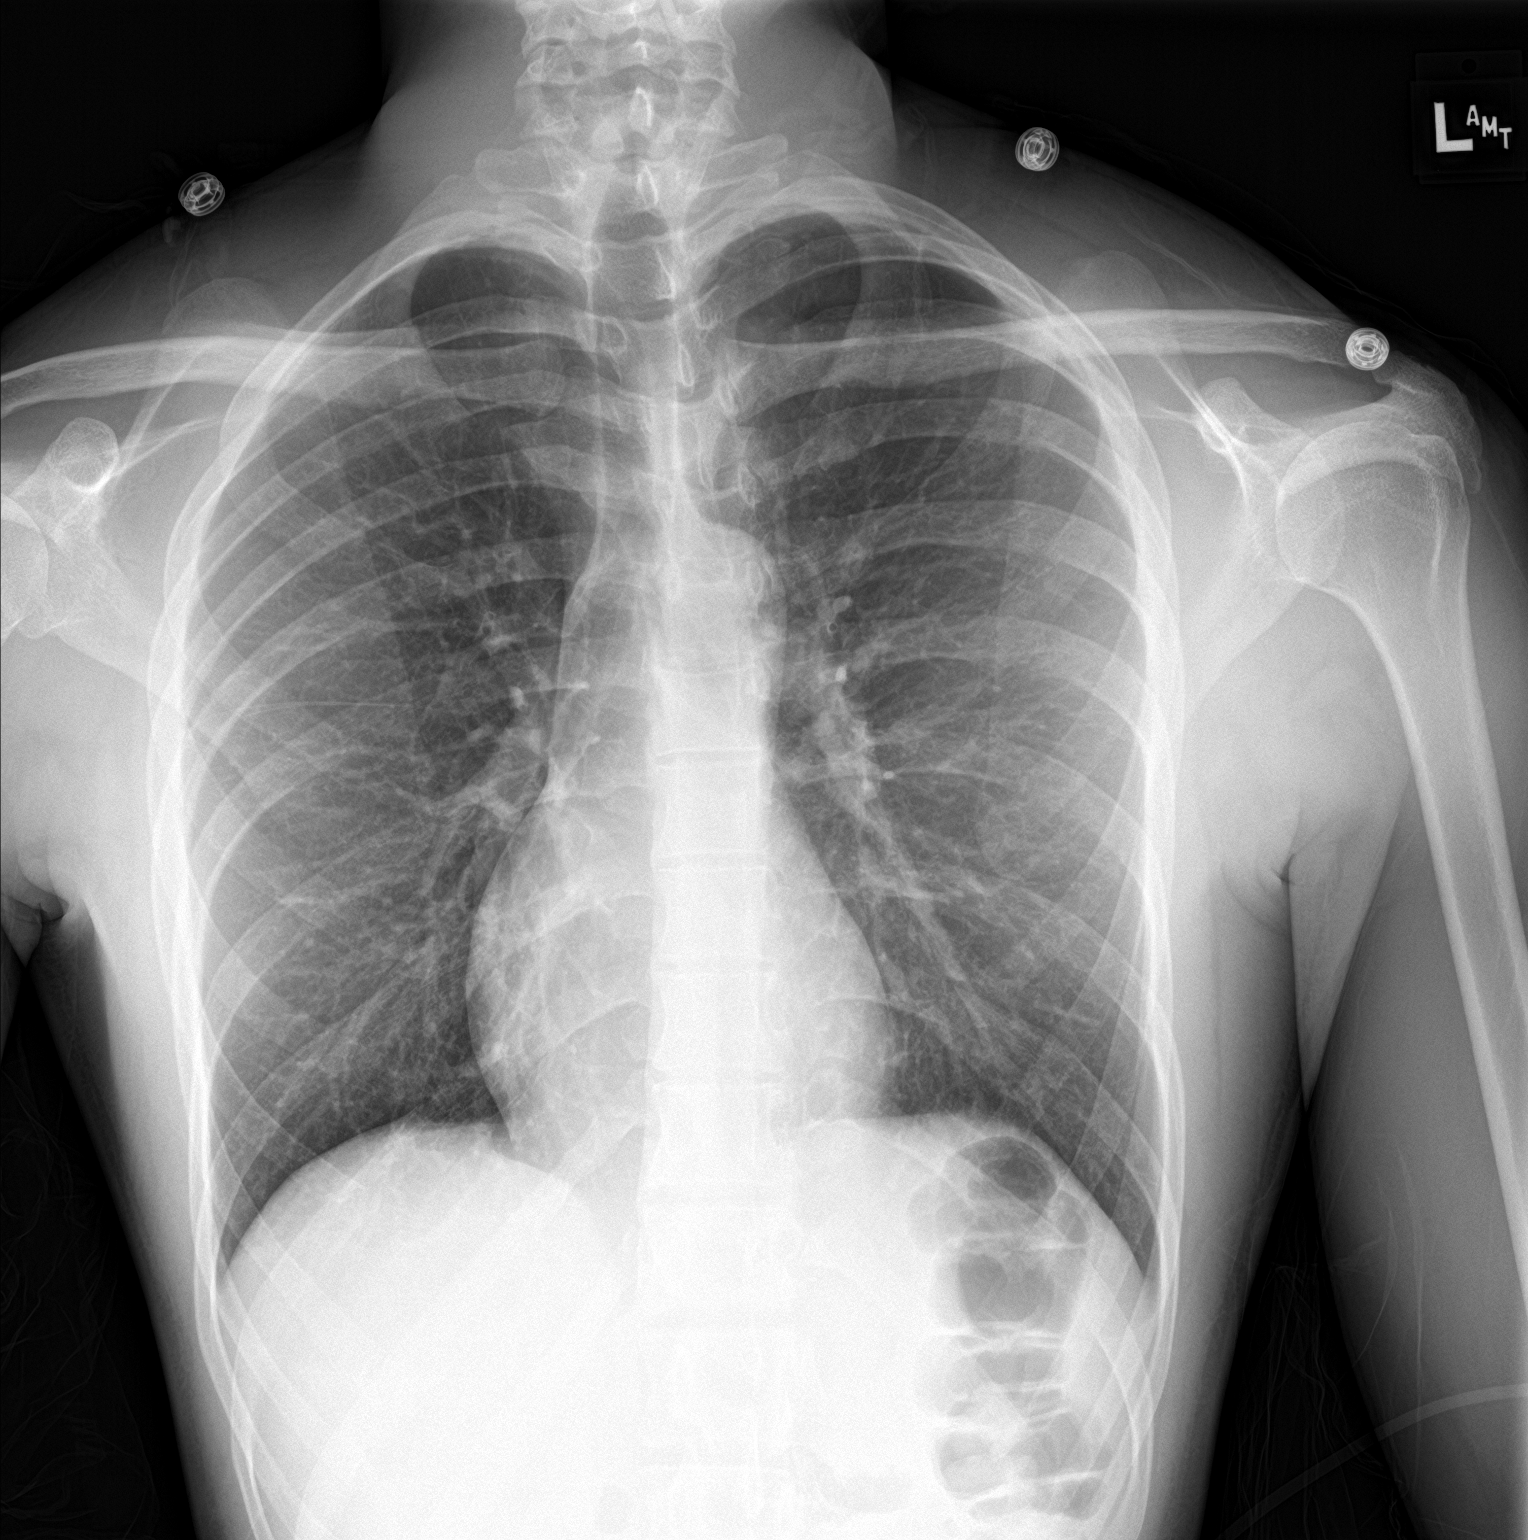

[chest lat]
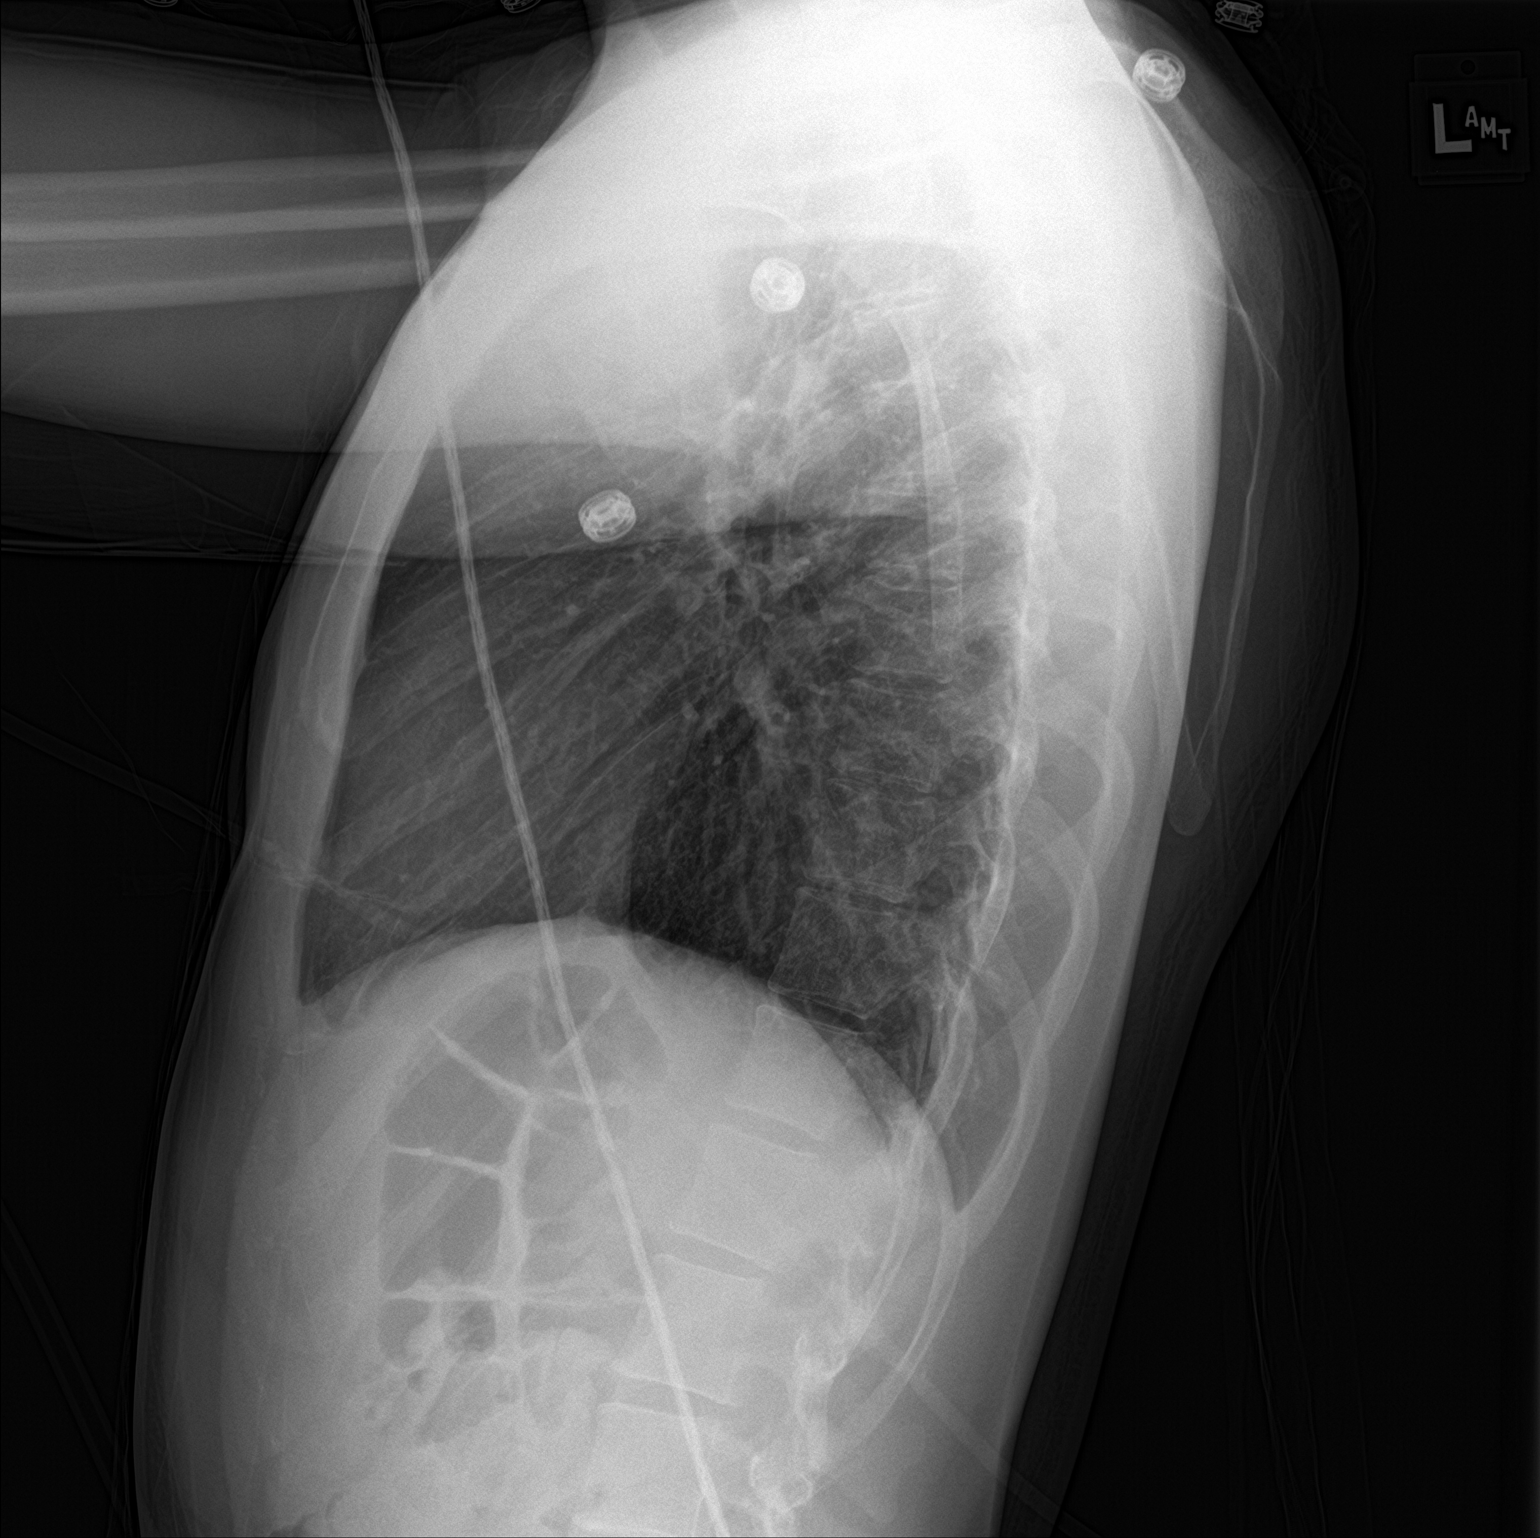

[2 of 2 positions shown; findings below may reference images not displayed]

FINDINGS: The heart size and mediastinal contours are within normal limits.
Both lungs are clear. The visualized skeletal structures are
unremarkable.
IMPRESSION: No active cardiopulmonary disease.
